# Patient Record
Sex: Female | Born: 1955 | Race: White | Hispanic: No | Smoking: Never smoker
Health system: Southern US, Community
[De-identification: ages and names within clinical notes are randomized; demographics above are authoritative.]

## PROBLEM LIST (undated history)

## (undated) DIAGNOSIS — B6790 Echinococcosis, unspecified: Secondary | ICD-10-CM

## (undated) DIAGNOSIS — E669 Obesity, unspecified: Secondary | ICD-10-CM

## (undated) DIAGNOSIS — R1310 Dysphagia, unspecified: Secondary | ICD-10-CM

## (undated) DIAGNOSIS — D369 Benign neoplasm, unspecified site: Secondary | ICD-10-CM

## (undated) DIAGNOSIS — G4730 Sleep apnea, unspecified: Secondary | ICD-10-CM

## (undated) DIAGNOSIS — E039 Hypothyroidism, unspecified: Secondary | ICD-10-CM

## (undated) DIAGNOSIS — K219 Gastro-esophageal reflux disease without esophagitis: Secondary | ICD-10-CM

## (undated) DIAGNOSIS — I38 Endocarditis, valve unspecified: Secondary | ICD-10-CM

## (undated) DIAGNOSIS — M2550 Pain in unspecified joint: Secondary | ICD-10-CM

## (undated) DIAGNOSIS — M549 Dorsalgia, unspecified: Secondary | ICD-10-CM

## (undated) DIAGNOSIS — R609 Edema, unspecified: Secondary | ICD-10-CM

## (undated) DIAGNOSIS — I10 Essential (primary) hypertension: Secondary | ICD-10-CM

## (undated) DIAGNOSIS — M069 Rheumatoid arthritis, unspecified: Secondary | ICD-10-CM

## (undated) HISTORY — PX: FOOT SURGERY: SHX648

## (undated) HISTORY — PX: PELVIC LAPAROSCOPY: SHX162

## (undated) HISTORY — DX: Sleep apnea, unspecified: G47.30

## (undated) HISTORY — PX: HERNIA REPAIR: SHX51

## (undated) HISTORY — DX: Echinococcosis, unspecified: B67.90

## (undated) HISTORY — DX: Pain in unspecified joint: M25.50

## (undated) HISTORY — DX: Benign neoplasm, unspecified site: D36.9

## (undated) HISTORY — DX: Dorsalgia, unspecified: M54.9

## (undated) HISTORY — DX: Rheumatoid arthritis, unspecified: M06.9

## (undated) HISTORY — PX: HYSTEROSCOPY: SHX211

## (undated) HISTORY — DX: Edema, unspecified: R60.9

## (undated) HISTORY — DX: Hypothyroidism, unspecified: E03.9

## (undated) HISTORY — DX: Essential (primary) hypertension: I10

## (undated) HISTORY — DX: Endocarditis, valve unspecified: I38

## (undated) HISTORY — DX: Obesity, unspecified: E66.9

## (undated) HISTORY — PX: DILATION AND CURETTAGE OF UTERUS: SHX78

## (undated) HISTORY — PX: ABDOMINAL HYSTERECTOMY: SHX81

## (undated) HISTORY — PX: MOUTH SURGERY: SHX715

## (undated) HISTORY — DX: Dysphagia, unspecified: R13.10

## (undated) HISTORY — DX: Gastro-esophageal reflux disease without esophagitis: K21.9

## (undated) HISTORY — PX: EYE SURGERY: SHX253

---

## 1998-02-11 ENCOUNTER — Encounter: Payer: Self-pay | Admitting: Internal Medicine

## 1998-02-11 ENCOUNTER — Ambulatory Visit (HOSPITAL_COMMUNITY): Admission: RE | Admit: 1998-02-11 | Discharge: 1998-02-11 | Payer: Self-pay | Admitting: Internal Medicine

## 1998-12-17 ENCOUNTER — Other Ambulatory Visit: Admission: RE | Admit: 1998-12-17 | Discharge: 1998-12-17 | Payer: Self-pay | Admitting: Obstetrics and Gynecology

## 1999-12-01 ENCOUNTER — Other Ambulatory Visit: Admission: RE | Admit: 1999-12-01 | Discharge: 1999-12-01 | Payer: Self-pay | Admitting: Obstetrics and Gynecology

## 2000-12-03 ENCOUNTER — Other Ambulatory Visit: Admission: RE | Admit: 2000-12-03 | Discharge: 2000-12-03 | Payer: Self-pay | Admitting: Obstetrics and Gynecology

## 2002-01-12 ENCOUNTER — Other Ambulatory Visit: Admission: RE | Admit: 2002-01-12 | Discharge: 2002-01-12 | Payer: Self-pay | Admitting: Obstetrics and Gynecology

## 2002-02-16 ENCOUNTER — Emergency Department (HOSPITAL_COMMUNITY): Admission: EM | Admit: 2002-02-16 | Discharge: 2002-02-16 | Payer: Self-pay | Admitting: Emergency Medicine

## 2002-02-16 ENCOUNTER — Encounter: Payer: Self-pay | Admitting: Emergency Medicine

## 2003-01-30 ENCOUNTER — Other Ambulatory Visit: Admission: RE | Admit: 2003-01-30 | Discharge: 2003-01-30 | Payer: Self-pay | Admitting: Obstetrics and Gynecology

## 2004-02-14 ENCOUNTER — Other Ambulatory Visit: Admission: RE | Admit: 2004-02-14 | Discharge: 2004-02-14 | Payer: Self-pay | Admitting: Obstetrics and Gynecology

## 2004-05-14 ENCOUNTER — Encounter: Admission: RE | Admit: 2004-05-14 | Discharge: 2004-05-14 | Payer: Self-pay | Admitting: Internal Medicine

## 2005-02-17 ENCOUNTER — Other Ambulatory Visit: Admission: RE | Admit: 2005-02-17 | Discharge: 2005-02-17 | Payer: Self-pay | Admitting: Obstetrics and Gynecology

## 2005-09-04 ENCOUNTER — Encounter: Admission: RE | Admit: 2005-09-04 | Discharge: 2005-09-04 | Payer: Self-pay | Admitting: Gastroenterology

## 2006-03-05 ENCOUNTER — Other Ambulatory Visit: Admission: RE | Admit: 2006-03-05 | Discharge: 2006-03-05 | Payer: Self-pay | Admitting: Obstetrics and Gynecology

## 2007-02-24 ENCOUNTER — Other Ambulatory Visit: Admission: RE | Admit: 2007-02-24 | Discharge: 2007-02-24 | Payer: Self-pay | Admitting: Obstetrics and Gynecology

## 2007-09-07 ENCOUNTER — Encounter: Admission: RE | Admit: 2007-09-07 | Discharge: 2007-09-07 | Payer: Self-pay | Admitting: Internal Medicine

## 2007-12-27 ENCOUNTER — Encounter: Admission: RE | Admit: 2007-12-27 | Discharge: 2007-12-27 | Payer: Self-pay | Admitting: Internal Medicine

## 2008-07-31 ENCOUNTER — Ambulatory Visit: Payer: Self-pay | Admitting: Obstetrics and Gynecology

## 2008-08-02 ENCOUNTER — Encounter: Payer: Self-pay | Admitting: Obstetrics and Gynecology

## 2008-08-02 ENCOUNTER — Ambulatory Visit: Payer: Self-pay | Admitting: Obstetrics and Gynecology

## 2008-08-02 ENCOUNTER — Other Ambulatory Visit: Admission: RE | Admit: 2008-08-02 | Discharge: 2008-08-02 | Payer: Self-pay | Admitting: Obstetrics and Gynecology

## 2008-09-25 ENCOUNTER — Ambulatory Visit (HOSPITAL_BASED_OUTPATIENT_CLINIC_OR_DEPARTMENT_OTHER): Admission: RE | Admit: 2008-09-25 | Discharge: 2008-09-25 | Payer: Self-pay | Admitting: General Surgery

## 2008-09-25 ENCOUNTER — Encounter (INDEPENDENT_AMBULATORY_CARE_PROVIDER_SITE_OTHER): Payer: Self-pay | Admitting: General Surgery

## 2009-08-21 ENCOUNTER — Encounter: Admission: RE | Admit: 2009-08-21 | Discharge: 2009-08-21 | Payer: Self-pay | Admitting: Internal Medicine

## 2009-09-13 ENCOUNTER — Ambulatory Visit: Payer: Self-pay | Admitting: Obstetrics and Gynecology

## 2009-09-13 ENCOUNTER — Other Ambulatory Visit: Admission: RE | Admit: 2009-09-13 | Discharge: 2009-09-13 | Payer: Self-pay | Admitting: Obstetrics and Gynecology

## 2010-09-23 NOTE — Op Note (Signed)
NAMEMERRIEL, Lauren Lambert           ACCOUNT NO.:  [id number]   MEDICAL RECORD NO.:  [id number]          PATIENT TYPE:  AMB   LOCATION:  DSC                          FACILITY:  [facility]   PHYSICIAN:  Ollen Gross. Vernell Morgans, M.D. DATE OF BIRTH:  July 05, 1955   DATE OF PROCEDURE:  09/25/2008  DATE OF DISCHARGE:                               OPERATIVE REPORT   PREOPERATIVE DIAGNOSIS:  Sebaceous cyst of the sternal area.   POSTOPERATIVE DIAGNOSIS:  Sebaceous cyst of the sternal area.   PROCEDURE:  Excision of sebaceous cyst.   SURGEON:  Ollen Gross. Vernell Morgans, MD   ANESTHESIA:  Local.   PROCEDURE:  After informed consent was obtained, the patient was brought  to the operating room, placed in a supine position on the operating room  table.  The patient's sternal area was then prepped with Betadine and  draped in usual sterile manner.  The area around the palpable cyst was  infiltrated with 1% lidocaine with epinephrine.  A small curvilinear  incision was made overlying the cyst with a #15 blade knife.  This  incision was carried down through the skin and subcutaneous tissue  sharply.  With a #15 blade knife into the subcutaneous fat, the cyst  itself was completely excised this way, and once it was removed, it was  sent to pathology for further evaluation.  The incision was then closed  with interrupted 4-0 Monocryl subcuticular stitches and a Dermabond  dressing was applied.  The patient tolerated the procedure well.  At the  end of the case, all needle, sponge, and instrument counts were correct.  The patient was then awakened, taken to recovery room in stable  condition.      Ollen Gross. Vernell Morgans, M.D.  Electronically Signed     PST/MEDQ  D:  09/25/2008  T:  09/26/2008  Job:  [id number]

## 2010-10-02 ENCOUNTER — Encounter: Payer: Self-pay | Admitting: Obstetrics and Gynecology

## 2010-10-13 ENCOUNTER — Other Ambulatory Visit (HOSPITAL_COMMUNITY)
Admission: RE | Admit: 2010-10-13 | Discharge: 2010-10-13 | Disposition: A | Discharge status: Home / Self Care | Payer: 59 | Source: Ambulatory Visit | Attending: Obstetrics and Gynecology | Admitting: Obstetrics and Gynecology

## 2010-10-13 ENCOUNTER — Other Ambulatory Visit: Payer: Self-pay | Admitting: Obstetrics and Gynecology

## 2010-10-13 ENCOUNTER — Encounter (INDEPENDENT_AMBULATORY_CARE_PROVIDER_SITE_OTHER): Payer: 59 | Admitting: Obstetrics and Gynecology

## 2010-10-13 DIAGNOSIS — Z124 Encounter for screening for malignant neoplasm of cervix: Secondary | ICD-10-CM | POA: Insufficient documentation

## 2010-10-13 DIAGNOSIS — Z01419 Encounter for gynecological examination (general) (routine) without abnormal findings: Secondary | ICD-10-CM

## 2012-01-06 ENCOUNTER — Encounter: Payer: Self-pay | Admitting: Gynecology

## 2012-01-06 DIAGNOSIS — E039 Hypothyroidism, unspecified: Secondary | ICD-10-CM | POA: Insufficient documentation

## 2012-01-06 DIAGNOSIS — I10 Essential (primary) hypertension: Secondary | ICD-10-CM | POA: Insufficient documentation

## 2012-01-06 DIAGNOSIS — B6790 Echinococcosis, unspecified: Secondary | ICD-10-CM | POA: Insufficient documentation

## 2012-01-12 ENCOUNTER — Ambulatory Visit (INDEPENDENT_AMBULATORY_CARE_PROVIDER_SITE_OTHER): Payer: 59 | Admitting: Obstetrics and Gynecology

## 2012-01-12 ENCOUNTER — Encounter: Payer: Self-pay | Admitting: Obstetrics and Gynecology

## 2012-01-12 VITALS — BP 120/70 | Ht 63.0 in | Wt 276.0 lb

## 2012-01-12 DIAGNOSIS — Z01419 Encounter for gynecological examination (general) (routine) without abnormal findings: Secondary | ICD-10-CM

## 2012-01-12 DIAGNOSIS — L293 Anogenital pruritus, unspecified: Secondary | ICD-10-CM

## 2012-01-12 DIAGNOSIS — N898 Other specified noninflammatory disorders of vagina: Secondary | ICD-10-CM

## 2012-01-12 LAB — WET PREP FOR TRICH, YEAST, CLUE

## 2012-01-12 MED ORDER — TERCONAZOLE 0.8 % VA CREA: 1.0000 | TOPICAL_CREAM | Freq: Every day | VAGINAL | Status: AC

## 2012-01-12 MED ORDER — BETAMETHASONE DIPROPIONATE AUG 0.05 % EX OINT: TOPICAL_OINTMENT | Freq: Two times a day (BID) | CUTANEOUS | Status: AC

## 2012-01-12 NOTE — Progress Notes (Signed)
Patient came to see me today for her annual GYN exam. She is not having menopausal symptoms. She is status post TAH done in 1995 for adenomyosis, fibroid, dysmenorrhea and menorrhagia. The patient has always had normal Pap smears. Her last Pap smear was 2012. She had her yearly mammogram today. She has had 2 normal bone densities. The last one was in 2010. She has been having vulvar and vaginal itching. She took a Teacher, adult education  and is much better but not cured. She has also noticed recently a rash on her right breast and chest wall which is very itchy. She is fine without estrogen replacement. She was enablex for detrussor instability but stopped it and is fine now. She does not have dysuria, frequency, urgency or incontinence.  HEENT: Within normal limits.Kennon Portela present. Neck: No masses. Supraclavicular lymph nodes: Not enlarged. Breasts: Examined in both sitting and lying position. Symmetrical without skin changes or masses. There is an erythematous raised rash on the lateral side of her right breast extending further lateral. It is in  several patches. Abdomen: Soft no masses guarding or rebound. No hernias. Pelvic: External within normal limits. BUS within normal limits. Vaginal examination shows good estrogen effect, no cystocele enterocele or rectocele. There is very little discharge and wet prep was negative. Cervix and uterus absent. Adnexa within normal limits. Rectovaginal confirmatory. Extremities within normal limits.  Assessment: #1. Residual yeast vaginitis #2. Skin rash  Plan: Terconazole 3 cream. Continue yearly mammograms. Diprolene ointment twice a day to rash. Dermatology visit if it persists.The new Pap smear guidelines were discussed with the patient. No pap done.

## 2012-01-12 NOTE — Patient Instructions (Signed)
Continue yearly mammograms 

## 2012-01-13 ENCOUNTER — Encounter: Payer: Self-pay | Admitting: Obstetrics and Gynecology

## 2012-01-13 LAB — URINALYSIS W MICROSCOPIC + REFLEX CULTURE
Bilirubin Urine: NEGATIVE
Ketones, ur: NEGATIVE mg/dL
Protein, ur: NEGATIVE mg/dL
Specific Gravity, Urine: 1.022 (ref 1.005–1.030)
Urobilinogen, UA: 0.2 mg/dL (ref 0.0–1.0)

## 2012-01-21 ENCOUNTER — Encounter: Payer: Self-pay | Admitting: Obstetrics and Gynecology

## 2015-11-21 DIAGNOSIS — R1010 Upper abdominal pain, unspecified: Secondary | ICD-10-CM | POA: Diagnosis not present

## 2015-11-21 DIAGNOSIS — K429 Umbilical hernia without obstruction or gangrene: Secondary | ICD-10-CM | POA: Diagnosis not present

## 2015-11-21 DIAGNOSIS — K439 Ventral hernia without obstruction or gangrene: Secondary | ICD-10-CM | POA: Diagnosis not present

## 2015-12-02 DIAGNOSIS — N3941 Urge incontinence: Secondary | ICD-10-CM | POA: Diagnosis not present

## 2015-12-02 DIAGNOSIS — Z6841 Body Mass Index (BMI) 40.0 and over, adult: Secondary | ICD-10-CM | POA: Diagnosis not present

## 2015-12-02 DIAGNOSIS — K429 Umbilical hernia without obstruction or gangrene: Secondary | ICD-10-CM | POA: Diagnosis not present

## 2015-12-09 DIAGNOSIS — K429 Umbilical hernia without obstruction or gangrene: Secondary | ICD-10-CM | POA: Diagnosis not present

## 2015-12-19 DIAGNOSIS — L579 Skin changes due to chronic exposure to nonionizing radiation, unspecified: Secondary | ICD-10-CM | POA: Diagnosis not present

## 2015-12-19 DIAGNOSIS — L298 Other pruritus: Secondary | ICD-10-CM | POA: Diagnosis not present

## 2015-12-19 DIAGNOSIS — L308 Other specified dermatitis: Secondary | ICD-10-CM | POA: Diagnosis not present

## 2015-12-19 DIAGNOSIS — D225 Melanocytic nevi of trunk: Secondary | ICD-10-CM | POA: Diagnosis not present

## 2015-12-20 DIAGNOSIS — Z01818 Encounter for other preprocedural examination: Secondary | ICD-10-CM | POA: Diagnosis not present

## 2015-12-20 DIAGNOSIS — K429 Umbilical hernia without obstruction or gangrene: Secondary | ICD-10-CM | POA: Diagnosis not present

## 2015-12-24 DIAGNOSIS — Z886 Allergy status to analgesic agent status: Secondary | ICD-10-CM | POA: Diagnosis not present

## 2015-12-24 DIAGNOSIS — Z885 Allergy status to narcotic agent status: Secondary | ICD-10-CM | POA: Diagnosis not present

## 2015-12-24 DIAGNOSIS — I10 Essential (primary) hypertension: Secondary | ICD-10-CM | POA: Diagnosis not present

## 2015-12-24 DIAGNOSIS — K5790 Diverticulosis of intestine, part unspecified, without perforation or abscess without bleeding: Secondary | ICD-10-CM | POA: Diagnosis not present

## 2015-12-24 DIAGNOSIS — K439 Ventral hernia without obstruction or gangrene: Secondary | ICD-10-CM | POA: Diagnosis not present

## 2015-12-24 DIAGNOSIS — Z91040 Latex allergy status: Secondary | ICD-10-CM | POA: Diagnosis not present

## 2015-12-24 DIAGNOSIS — K219 Gastro-esophageal reflux disease without esophagitis: Secondary | ICD-10-CM | POA: Diagnosis not present

## 2015-12-24 DIAGNOSIS — K429 Umbilical hernia without obstruction or gangrene: Secondary | ICD-10-CM | POA: Diagnosis not present

## 2015-12-24 DIAGNOSIS — K449 Diaphragmatic hernia without obstruction or gangrene: Secondary | ICD-10-CM | POA: Diagnosis not present

## 2015-12-24 DIAGNOSIS — Z6841 Body Mass Index (BMI) 40.0 and over, adult: Secondary | ICD-10-CM | POA: Diagnosis not present

## 2015-12-25 DIAGNOSIS — K439 Ventral hernia without obstruction or gangrene: Secondary | ICD-10-CM | POA: Diagnosis not present

## 2015-12-25 DIAGNOSIS — K449 Diaphragmatic hernia without obstruction or gangrene: Secondary | ICD-10-CM | POA: Diagnosis not present

## 2015-12-25 DIAGNOSIS — I10 Essential (primary) hypertension: Secondary | ICD-10-CM | POA: Diagnosis not present

## 2015-12-25 DIAGNOSIS — K219 Gastro-esophageal reflux disease without esophagitis: Secondary | ICD-10-CM | POA: Diagnosis not present

## 2015-12-25 DIAGNOSIS — Z91040 Latex allergy status: Secondary | ICD-10-CM | POA: Diagnosis not present

## 2015-12-25 DIAGNOSIS — K5790 Diverticulosis of intestine, part unspecified, without perforation or abscess without bleeding: Secondary | ICD-10-CM | POA: Diagnosis not present

## 2015-12-25 DIAGNOSIS — Z6841 Body Mass Index (BMI) 40.0 and over, adult: Secondary | ICD-10-CM | POA: Diagnosis not present

## 2015-12-25 DIAGNOSIS — Z885 Allergy status to narcotic agent status: Secondary | ICD-10-CM | POA: Diagnosis not present

## 2015-12-25 DIAGNOSIS — Z886 Allergy status to analgesic agent status: Secondary | ICD-10-CM | POA: Diagnosis not present

## 2016-01-01 DIAGNOSIS — L308 Other specified dermatitis: Secondary | ICD-10-CM | POA: Diagnosis not present

## 2016-01-01 DIAGNOSIS — L298 Other pruritus: Secondary | ICD-10-CM | POA: Diagnosis not present

## 2016-01-14 DIAGNOSIS — G4733 Obstructive sleep apnea (adult) (pediatric): Secondary | ICD-10-CM | POA: Diagnosis not present

## 2016-01-29 DIAGNOSIS — K429 Umbilical hernia without obstruction or gangrene: Secondary | ICD-10-CM | POA: Diagnosis not present

## 2016-01-31 DIAGNOSIS — K429 Umbilical hernia without obstruction or gangrene: Secondary | ICD-10-CM | POA: Diagnosis not present

## 2016-02-09 DIAGNOSIS — L03113 Cellulitis of right upper limb: Secondary | ICD-10-CM | POA: Diagnosis not present

## 2016-02-12 DIAGNOSIS — K8020 Calculus of gallbladder without cholecystitis without obstruction: Secondary | ICD-10-CM | POA: Diagnosis not present

## 2016-02-13 DIAGNOSIS — G4733 Obstructive sleep apnea (adult) (pediatric): Secondary | ICD-10-CM | POA: Diagnosis not present

## 2016-02-20 DIAGNOSIS — L308 Other specified dermatitis: Secondary | ICD-10-CM | POA: Diagnosis not present

## 2016-02-20 DIAGNOSIS — R232 Flushing: Secondary | ICD-10-CM | POA: Diagnosis not present

## 2016-02-20 DIAGNOSIS — L298 Other pruritus: Secondary | ICD-10-CM | POA: Diagnosis not present

## 2016-03-02 DIAGNOSIS — M1711 Unilateral primary osteoarthritis, right knee: Secondary | ICD-10-CM | POA: Diagnosis not present

## 2016-03-02 DIAGNOSIS — M25561 Pain in right knee: Secondary | ICD-10-CM | POA: Diagnosis not present

## 2016-03-04 DIAGNOSIS — M25561 Pain in right knee: Secondary | ICD-10-CM | POA: Diagnosis not present

## 2016-03-04 DIAGNOSIS — M1711 Unilateral primary osteoarthritis, right knee: Secondary | ICD-10-CM | POA: Diagnosis not present

## 2016-03-04 DIAGNOSIS — M62551 Muscle wasting and atrophy, not elsewhere classified, right thigh: Secondary | ICD-10-CM | POA: Diagnosis not present

## 2016-03-05 DIAGNOSIS — M25561 Pain in right knee: Secondary | ICD-10-CM | POA: Diagnosis not present

## 2016-03-05 DIAGNOSIS — M1711 Unilateral primary osteoarthritis, right knee: Secondary | ICD-10-CM | POA: Diagnosis not present

## 2016-03-05 DIAGNOSIS — M62551 Muscle wasting and atrophy, not elsewhere classified, right thigh: Secondary | ICD-10-CM | POA: Diagnosis not present

## 2016-03-06 DIAGNOSIS — M62551 Muscle wasting and atrophy, not elsewhere classified, right thigh: Secondary | ICD-10-CM | POA: Diagnosis not present

## 2016-03-06 DIAGNOSIS — M1711 Unilateral primary osteoarthritis, right knee: Secondary | ICD-10-CM | POA: Diagnosis not present

## 2016-03-06 DIAGNOSIS — M25561 Pain in right knee: Secondary | ICD-10-CM | POA: Diagnosis not present

## 2016-03-10 DIAGNOSIS — M62551 Muscle wasting and atrophy, not elsewhere classified, right thigh: Secondary | ICD-10-CM | POA: Diagnosis not present

## 2016-03-10 DIAGNOSIS — M25561 Pain in right knee: Secondary | ICD-10-CM | POA: Diagnosis not present

## 2016-03-10 DIAGNOSIS — M1711 Unilateral primary osteoarthritis, right knee: Secondary | ICD-10-CM | POA: Diagnosis not present

## 2016-03-11 DIAGNOSIS — M62551 Muscle wasting and atrophy, not elsewhere classified, right thigh: Secondary | ICD-10-CM | POA: Diagnosis not present

## 2016-03-11 DIAGNOSIS — M1711 Unilateral primary osteoarthritis, right knee: Secondary | ICD-10-CM | POA: Diagnosis not present

## 2016-03-11 DIAGNOSIS — M25561 Pain in right knee: Secondary | ICD-10-CM | POA: Diagnosis not present

## 2016-03-24 DIAGNOSIS — M25551 Pain in right hip: Secondary | ICD-10-CM | POA: Diagnosis not present

## 2016-03-24 DIAGNOSIS — M1711 Unilateral primary osteoarthritis, right knee: Secondary | ICD-10-CM | POA: Diagnosis not present

## 2016-03-24 DIAGNOSIS — M2241 Chondromalacia patellae, right knee: Secondary | ICD-10-CM | POA: Diagnosis not present

## 2016-03-24 DIAGNOSIS — M25561 Pain in right knee: Secondary | ICD-10-CM | POA: Diagnosis not present

## 2016-03-24 DIAGNOSIS — M62551 Muscle wasting and atrophy, not elsewhere classified, right thigh: Secondary | ICD-10-CM | POA: Diagnosis not present

## 2016-03-24 DIAGNOSIS — M25552 Pain in left hip: Secondary | ICD-10-CM | POA: Diagnosis not present

## 2016-03-25 DIAGNOSIS — M25561 Pain in right knee: Secondary | ICD-10-CM | POA: Diagnosis not present

## 2016-03-25 DIAGNOSIS — M1711 Unilateral primary osteoarthritis, right knee: Secondary | ICD-10-CM | POA: Diagnosis not present

## 2016-03-25 DIAGNOSIS — M62551 Muscle wasting and atrophy, not elsewhere classified, right thigh: Secondary | ICD-10-CM | POA: Diagnosis not present

## 2016-03-27 DIAGNOSIS — M62551 Muscle wasting and atrophy, not elsewhere classified, right thigh: Secondary | ICD-10-CM | POA: Diagnosis not present

## 2016-03-27 DIAGNOSIS — M1711 Unilateral primary osteoarthritis, right knee: Secondary | ICD-10-CM | POA: Diagnosis not present

## 2016-03-27 DIAGNOSIS — M25561 Pain in right knee: Secondary | ICD-10-CM | POA: Diagnosis not present

## 2016-05-05 DIAGNOSIS — M25461 Effusion, right knee: Secondary | ICD-10-CM | POA: Diagnosis not present

## 2016-05-05 DIAGNOSIS — M25551 Pain in right hip: Secondary | ICD-10-CM | POA: Diagnosis not present

## 2016-05-05 DIAGNOSIS — M2241 Chondromalacia patellae, right knee: Secondary | ICD-10-CM | POA: Diagnosis not present

## 2016-05-07 DIAGNOSIS — M1711 Unilateral primary osteoarthritis, right knee: Secondary | ICD-10-CM | POA: Diagnosis not present

## 2016-05-07 DIAGNOSIS — S83241D Other tear of medial meniscus, current injury, right knee, subsequent encounter: Secondary | ICD-10-CM | POA: Diagnosis not present

## 2016-05-07 DIAGNOSIS — R294 Clicking hip: Secondary | ICD-10-CM | POA: Diagnosis not present

## 2016-05-07 DIAGNOSIS — M25551 Pain in right hip: Secondary | ICD-10-CM | POA: Diagnosis not present

## 2016-05-08 DIAGNOSIS — Z1231 Encounter for screening mammogram for malignant neoplasm of breast: Secondary | ICD-10-CM | POA: Diagnosis not present

## 2016-05-12 DIAGNOSIS — M1711 Unilateral primary osteoarthritis, right knee: Secondary | ICD-10-CM | POA: Diagnosis not present

## 2016-05-19 DIAGNOSIS — M1711 Unilateral primary osteoarthritis, right knee: Secondary | ICD-10-CM | POA: Diagnosis not present

## 2016-05-26 DIAGNOSIS — M1711 Unilateral primary osteoarthritis, right knee: Secondary | ICD-10-CM | POA: Diagnosis not present

## 2016-06-02 DIAGNOSIS — M25562 Pain in left knee: Secondary | ICD-10-CM | POA: Diagnosis not present

## 2016-06-02 DIAGNOSIS — M25551 Pain in right hip: Secondary | ICD-10-CM | POA: Diagnosis not present

## 2016-06-02 DIAGNOSIS — M1711 Unilateral primary osteoarthritis, right knee: Secondary | ICD-10-CM | POA: Diagnosis not present

## 2016-06-02 DIAGNOSIS — N6001 Solitary cyst of right breast: Secondary | ICD-10-CM | POA: Diagnosis not present

## 2016-06-02 DIAGNOSIS — N6313 Unspecified lump in the right breast, lower outer quadrant: Secondary | ICD-10-CM | POA: Diagnosis not present

## 2016-06-02 DIAGNOSIS — R928 Other abnormal and inconclusive findings on diagnostic imaging of breast: Secondary | ICD-10-CM | POA: Diagnosis not present

## 2016-06-02 DIAGNOSIS — N6312 Unspecified lump in the right breast, upper inner quadrant: Secondary | ICD-10-CM | POA: Diagnosis not present

## 2016-06-09 DIAGNOSIS — M1711 Unilateral primary osteoarthritis, right knee: Secondary | ICD-10-CM | POA: Diagnosis not present

## 2016-06-16 DIAGNOSIS — M1711 Unilateral primary osteoarthritis, right knee: Secondary | ICD-10-CM | POA: Diagnosis not present

## 2016-07-15 DIAGNOSIS — J019 Acute sinusitis, unspecified: Secondary | ICD-10-CM | POA: Diagnosis not present

## 2016-07-15 DIAGNOSIS — H109 Unspecified conjunctivitis: Secondary | ICD-10-CM | POA: Diagnosis not present

## 2016-07-29 DIAGNOSIS — J069 Acute upper respiratory infection, unspecified: Secondary | ICD-10-CM | POA: Diagnosis not present

## 2016-07-31 DIAGNOSIS — J069 Acute upper respiratory infection, unspecified: Secondary | ICD-10-CM | POA: Diagnosis not present

## 2016-09-09 ENCOUNTER — Telehealth: Payer: Self-pay | Admitting: *Deleted

## 2016-09-09 NOTE — Telephone Encounter (Signed)
Pt returned call and states that she received the new patient packet in her email and she will fill it out and return it tonight. States she did not have time to complete call.

## 2016-09-09 NOTE — Telephone Encounter (Signed)
PreVisit Call attempted. Left VM. 

## 2016-09-10 ENCOUNTER — Ambulatory Visit (INDEPENDENT_AMBULATORY_CARE_PROVIDER_SITE_OTHER): Payer: BLUE CROSS/BLUE SHIELD | Admitting: Family Medicine

## 2016-09-10 ENCOUNTER — Encounter: Payer: Self-pay | Admitting: Family Medicine

## 2016-09-10 VITALS — BP 124/72 | HR 84 | Temp 97.9°F | Ht 63.0 in | Wt 271.2 lb

## 2016-09-10 DIAGNOSIS — I10 Essential (primary) hypertension: Secondary | ICD-10-CM | POA: Diagnosis not present

## 2016-09-10 DIAGNOSIS — J0100 Acute maxillary sinusitis, unspecified: Secondary | ICD-10-CM | POA: Diagnosis not present

## 2016-09-10 MED ORDER — CEFDINIR 300 MG PO CAPS: 300.0000 mg | ORAL_CAPSULE | Freq: Two times a day (BID) | ORAL | 0 refills | Status: DC

## 2016-09-10 MED ORDER — LOSARTAN POTASSIUM-HCTZ 100-12.5 MG PO TABS: 1.0000 | ORAL_TABLET | Freq: Every day | ORAL | 2 refills | Status: DC

## 2016-09-10 MED ORDER — PREDNISONE 5 MG PO TABS: ORAL_TABLET | ORAL | 0 refills | Status: DC

## 2016-09-10 NOTE — Addendum Note (Signed)
Addended by: Frutoso Chase A on: 09/10/2016 10:25 AM   Modules accepted: Orders

## 2016-09-10 NOTE — Progress Notes (Signed)
Lauren Lambert is a 61 y.o. female is here to [place].   History of Present Illness:   [name] CMA acting as scribe for Dr. Juleen China.  CC: Patient comes in today to establish care. She has had ear pain, congestion, and cough since March 1st. She has been on a Zpak and Amoxicillin. She had conjunctivitis as well and was put on an eye drop. This cleared up the eyes.  Sinusitis  This is a recurrent problem. The current episode started 1 to 4 weeks ago. The problem has been rapidly worsening since onset. There has been no fever. The pain is moderate. Associated symptoms include congestion, coughing, ear pain, sinus pressure and swollen glands. Pertinent negatives include no chills, diaphoresis, headaches, hoarse voice, neck pain, shortness of breath, sneezing or sore throat. Past treatments include antibiotics, oral decongestants and acetaminophen. The treatment provided mild relief.  Knee Pain   The pain is present in the left knee and right knee. The quality of the pain is described as aching. The pain is mild. The pain has been intermittent since onset. The symptoms are aggravated by weight bearing. Treatments tried: most recently - PRP and stem cell injections. The treatment provided moderate relief.  Obesity: New Discussion  Patient complains of obesity. Patient cites health as reasons for wanting to lose weight.Successful weight loss techniques attempted: Weight Watchers. Current Exercise Habits: none. Barriers: Travel and hunger.  Health Maintenance Due  Topic Date Due  . Hepatitis C Screening  03/07/1956  . HIV Screening  08/06/1972  . TETANUS/TDAP  08/06/1976  . MAMMOGRAM  08/07/2007  . COLONOSCOPY  08/07/2007  . PAP SMEAR  10/12/2013   PMHx, SurgHx, SocialHx, Medications, and Allergies were reviewed in the Visit Navigator and updated as appropriate.   Past Medical History:  Diagnosis Date  . Hydatid cyst    Right  . Hypertension   . Hypothyroidism    Past  Surgical History:  Procedure Laterality Date  . ABDOMINAL HYSTERECTOMY    . DILATION AND CURETTAGE OF UTERUS    . EYE SURGERY    . FOOT SURGERY    . HYSTEROSCOPY    . MOUTH SURGERY    . PELVIC LAPAROSCOPY     Family History  Problem Relation Age of Onset  . Hypertension Mother   . Heart disease Mother   . Hypertension Father   . Heart disease Father   . Breast cancer Maternal Aunt     Age 35's  . Colon cancer Maternal Grandmother   . Uterine cancer Maternal Aunt    Social History  Substance Use Topics  . Smoking status: Never Smoker  . Smokeless tobacco: Never Used  . Alcohol use 3.0 oz/week    6 Standard drinks or equivalent per week   Current Medications and Allergies:   .  losartan-hydrochlorothiazide (HYZAAR) 100-12.5 MG tablet, Take 1 tablet by mouth daily., Disp: 90 tablet, Rfl: 2 .  Multiple Vitamin (MULTIVITAMIN) tablet, Take 1 tablet by mouth daily., Disp: , Rfl:  .  Probiotic Product (ALIGN) 4 MG CAPS, Take by mouth., Disp: , Rfl:   Allergies  Allergen Reactions  . Adhesive [Tape]   . Aspirin   . Ciprofloxacin Other (See Comments)  . Codeine Nausea Only  . Hydrocodone-Acetaminophen Other (See Comments)  . Latex    Review of Systems:   Review of Systems  Constitutional: Positive for malaise/fatigue. Negative for chills, diaphoresis and fever.  HENT: Positive for congestion, ear pain, sinus pain and sinus  pressure. Negative for hoarse voice, sneezing and sore throat.   Eyes: Negative for blurred vision and double vision.  Respiratory: Positive for cough. Negative for shortness of breath and wheezing.   Cardiovascular: Negative for chest pain, palpitations and leg swelling.  Gastrointestinal: Negative for abdominal pain, constipation, diarrhea and vomiting.  Genitourinary: Negative for dysuria.  Musculoskeletal: Positive for joint pain. Negative for back pain and neck pain.       Chronic.   Skin: Negative for itching and rash.  Neurological: Negative for  dizziness and headaches.  Psychiatric/Behavioral: Negative for depression, hallucinations and memory loss.   Vitals:   Vitals:   09/10/16 0909  BP: 124/72  Pulse: 84  Temp: 97.9 F (36.6 C)  TempSrc: Oral  SpO2: 96%  Weight: 271 lb 3.2 oz (123 kg)  Height: 5\' 3"  (1.6 m)     Body mass index is 48.04 kg/m.  Physical Exam:   Physical Exam  Constitutional: She is oriented to person, place, and time. She appears well-nourished.  HENT:  Head: Normocephalic and atraumatic.  Nose: Rhinorrhea present. Right sinus exhibits maxillary sinus tenderness. Left sinus exhibits maxillary sinus tenderness.  Eyes: EOM are normal. Pupils are equal, round, and reactive to light.  Neck: Normal range of motion. Neck supple.  Cardiovascular: Normal rate, regular rhythm, normal heart sounds and intact distal pulses.   Pulmonary/Chest: Effort normal.  Abdominal: Soft.  Musculoskeletal:       Right knee: Tenderness found. Medial joint line and lateral joint line tenderness noted.       Left knee: Tenderness found. Medial joint line and lateral joint line tenderness noted.  Neurological: She is alert and oriented to person, place, and time.  Skin: Skin is warm.  Psychiatric: She has a normal mood and affect. Her behavior is normal.  Nursing note and vitals reviewed.  Assessment and Plan:   Lauren Lambert was seen today for establish care.  Diagnoses and all orders for this visit:  Essential hypertension Comments: Stable. Controlled on current medication without side effects. Orders: -     losartan-hydrochlorothiazide (HYZAAR) 100-12.5 MG tablet; Take 1 tablet by mouth daily. -     Comprehensive metabolic panel; Future  Morbid obesity ([place]) Comments: We reviewed multiple weight loss strategies. I recommend evaluation with treatment options by Dr. Leafy Ro. Labs pending. Upcoming physical exam.. Orders: -     Amb Referral to Bariatric Surgery -     CBC -     Comprehensive metabolic panel;  Future -     Lipid panel; Future -     TSH; Future -     Hemoglobin A1c; Future -     T3, free; Future -     Insulin, Free (Bioactive); Future  Subacute maxillary sinusitis Comments: Recurrent. With associated left eustachian tube dysfunction. Treatment as below. Red flags reviewed. Orders: -     cefdinir (OMNICEF) 300 MG capsule; Take 1 capsule (300 mg total) by mouth 2 (two) times daily. -     predniSONE (DELTASONE) 5 MG tablet; 6,5,4,3,2,1,done    . Reviewed expectations re: course of current medical issues. . Discussed self-management of symptoms. . Outlined signs and symptoms indicating need for more acute intervention. . Patient verbalized understanding and all questions were answered. . See orders for this visit as documented in the electronic medical record. . Patient received an After Visit Summary.  Records requested if needed. I spent 45 minutes with this patient, greater than 50% was face-to-face time counseling regarding the above diagnoses.  CMA  served as Education administrator during this visit. History, Physical, and Plan performed by medical provider. Documentation and orders reviewed and attested to. Briscoe Deutscher, D.O.  Briscoe Deutscher, [place], [place] [facility] 09/10/2016

## 2016-09-15 ENCOUNTER — Telehealth: Payer: Self-pay | Admitting: Family Medicine

## 2016-09-15 MED ORDER — DOXYCYCLINE HYCLATE 100 MG PO TABS: 100.0000 mg | ORAL_TABLET | Freq: Two times a day (BID) | ORAL | 0 refills | Status: DC

## 2016-09-15 NOTE — Telephone Encounter (Signed)
I called in DOXY. Try for one week. If not better, come in for recheck.

## 2016-09-15 NOTE — Telephone Encounter (Signed)
Patient called to advise that she is still taking her antibiotics however, her sinus infection has come back. Please call patient to advise.

## 2016-09-15 NOTE — Telephone Encounter (Signed)
Spoke with patient and she stated that has been on the antibiotic for almost a week. Her congestion had gotten better and now the mucus is turning green again. Cough is worse today. Do you want her to be seen.

## 2016-09-15 NOTE — Addendum Note (Signed)
Addended by: Briscoe Deutscher R on: 09/15/2016 08:00 PM   Modules accepted: Orders

## 2016-09-16 NOTE — Telephone Encounter (Signed)
Spoke with patient and she stated that she is doing better today. She is not going to pick up antibiotic unless she starts feeling bad again.

## 2016-09-18 ENCOUNTER — Other Ambulatory Visit (INDEPENDENT_AMBULATORY_CARE_PROVIDER_SITE_OTHER): Payer: BLUE CROSS/BLUE SHIELD

## 2016-09-18 DIAGNOSIS — I10 Essential (primary) hypertension: Secondary | ICD-10-CM

## 2016-09-18 LAB — T3, FREE: T3, Free: 3.2 pg/mL (ref 2.3–4.2)

## 2016-09-18 LAB — LIPID PANEL
Cholesterol: 173 mg/dL (ref 0–200)
HDL: 49.8 mg/dL (ref >39.00)
LDL Cholesterol: 105 mg/dL — ABNORMAL HIGH (ref 0–99)
NonHDL: 123.69
Total CHOL/HDL Ratio: 3
Triglycerides: 93 mg/dL (ref 0.0–149.0)
VLDL: 18.6 mg/dL (ref 0.0–40.0)

## 2016-09-18 LAB — COMPREHENSIVE METABOLIC PANEL
ALT: 13 U/L (ref 0–35)
AST: 14 U/L (ref 0–37)
Albumin: 4.3 g/dL (ref 3.5–5.2)
Alkaline Phosphatase: 78 U/L (ref 39–117)
BUN: 20 mg/dL (ref 6–23)
CO2: 27 mEq/L (ref 19–32)
Calcium: 9.3 mg/dL (ref 8.4–10.5)
Chloride: 101 mEq/L (ref 96–112)
Creatinine, Ser: 0.83 mg/dL (ref 0.40–1.20)
GFR: 74.76 mL/min (ref >60.00)
Glucose, Bld: 95 mg/dL (ref 70–99)
Potassium: 3.6 mEq/L (ref 3.5–5.1)
Sodium: 136 mEq/L (ref 135–145)
Total Bilirubin: 0.8 mg/dL (ref 0.2–1.2)
Total Protein: 7.3 g/dL (ref 6.0–8.3)

## 2016-09-18 LAB — CBC
HCT: 42.9 % (ref 36.0–46.0)
Hemoglobin: 14.4 g/dL (ref 12.0–15.0)
MCHC: 33.6 g/dL (ref 30.0–36.0)
MCV: 83.5 fl (ref 78.0–100.0)
Platelets: 289 10*3/uL (ref 150.0–400.0)
RBC: 5.14 Mil/uL — ABNORMAL HIGH (ref 3.87–5.11)
RDW: 15.3 % (ref 11.5–15.5)
WBC: 7.8 10*3/uL (ref 4.0–10.5)

## 2016-09-18 LAB — TSH: TSH: 2.86 u[IU]/mL (ref 0.35–4.50)

## 2016-09-18 LAB — HEMOGLOBIN A1C: Hgb A1c MFr Bld: 6.1 % (ref 4.6–6.5)

## 2016-09-24 ENCOUNTER — Encounter (INDEPENDENT_AMBULATORY_CARE_PROVIDER_SITE_OTHER): Payer: Self-pay | Admitting: Family Medicine

## 2016-09-24 LAB — INSULIN, FREE (BIOACTIVE): Insulin, Free: 7.5 u[IU]/mL (ref 1.5–14.9)

## 2016-10-01 ENCOUNTER — Encounter: Payer: BLUE CROSS/BLUE SHIELD | Admitting: Family Medicine

## 2016-10-06 ENCOUNTER — Telehealth: Payer: Self-pay | Admitting: Family Medicine

## 2016-10-06 DIAGNOSIS — R05 Cough: Secondary | ICD-10-CM | POA: Diagnosis not present

## 2016-10-06 DIAGNOSIS — R0982 Postnasal drip: Secondary | ICD-10-CM | POA: Diagnosis not present

## 2016-10-06 DIAGNOSIS — R062 Wheezing: Secondary | ICD-10-CM | POA: Diagnosis not present

## 2016-10-06 DIAGNOSIS — J309 Allergic rhinitis, unspecified: Secondary | ICD-10-CM | POA: Diagnosis not present

## 2016-10-06 NOTE — Telephone Encounter (Signed)
Patient Name: Lauren Lambert  DOB: 1956-04-03    Initial Comment Caller states that 3 wks ago she was told she had food in her lungs and a blocked eustachian tube. She was prescribed prednisone and antibiotics.She states that her friend noticed that when she coughs she has a crackle in her lungs. She is currently out of town in [facility] and wants to know if she needs more antbioitcs.    Nurse Assessment  Nurse: Raphael Gibney, RN, Vanita Ingles Date/Time (Eastern Time): 10/06/2016 1:47:27 PM  Confirm and document reason for call. If symptomatic, describe symptoms. ---Caller states she was seen 3 weeks ago for food in her lungs and blocked eustachian tubes. She took prednisone and a round of antibiotics. She called because she was still congested and the doctor ordered more antibiotics which she has not filled. She is continuing her zyrtec. She is coughing more often and her friend is feeling crackles in her lungs at her back when she coughs. No fever. She is out of town in [place]. Cough is still productive mostly clear and white but sometimes is yellow. She has noticed wheezing when she lays down.  Does the patient have any new or worsening symptoms? ---Yes  Will a triage be completed? ---Yes  Related visit to physician within the last 2 weeks? ---No  Does the PT have any chronic conditions? (i.e. diabetes, asthma, etc.) ---No  Is this a behavioral health or substance abuse call? ---No     Guidelines    Guideline Title Affirmed Question Affirmed Notes  Cough - Acute Productive [1] Continuous (nonstop) coughing interferes with work or school AND [2] no improvement using cough treatment per Care Advice    Final Disposition User   See Physician within 24 Hours [place], Therapist, sports, Vanita Ingles    Comments  pt is out of town in [facility] and does not want to go to urgent care She wants to know if she needs another round of antibiotics or is there something else she needs to buy or the doctor can prescribe to help the crackles in her lungs.  Please call pt back regarding medication.   Referrals  GO TO FACILITY REFUSED   Disagree/Comply: Disagree  Disagree/Comply Reason: Disagree with instructions

## 2016-10-06 NOTE — Telephone Encounter (Signed)
Will need eval. Try urgent care if out of state.

## 2016-10-06 NOTE — Telephone Encounter (Signed)
Pt is aware of annotations and agreeable for urgent care.

## 2016-10-15 ENCOUNTER — Ambulatory Visit (INDEPENDENT_AMBULATORY_CARE_PROVIDER_SITE_OTHER): Payer: BLUE CROSS/BLUE SHIELD | Admitting: Family Medicine

## 2016-10-15 ENCOUNTER — Encounter: Payer: Self-pay | Admitting: Family Medicine

## 2016-10-15 ENCOUNTER — Encounter (INDEPENDENT_AMBULATORY_CARE_PROVIDER_SITE_OTHER): Payer: Self-pay | Admitting: Family Medicine

## 2016-10-15 VITALS — BP 114/80 | HR 85 | Temp 98.0°F | Ht 63.0 in | Wt 270.0 lb

## 2016-10-15 VITALS — BP 123/79 | HR 88 | Temp 97.9°F | Ht 63.0 in | Wt 264.0 lb

## 2016-10-15 DIAGNOSIS — I10 Essential (primary) hypertension: Secondary | ICD-10-CM | POA: Diagnosis not present

## 2016-10-15 DIAGNOSIS — Z1331 Encounter for screening for depression: Secondary | ICD-10-CM

## 2016-10-15 DIAGNOSIS — Z0000 Encounter for general adult medical examination without abnormal findings: Secondary | ICD-10-CM | POA: Diagnosis not present

## 2016-10-15 DIAGNOSIS — R5383 Other fatigue: Secondary | ICD-10-CM

## 2016-10-15 DIAGNOSIS — M174 Other bilateral secondary osteoarthritis of knee: Secondary | ICD-10-CM

## 2016-10-15 DIAGNOSIS — R0602 Shortness of breath: Secondary | ICD-10-CM | POA: Diagnosis not present

## 2016-10-15 DIAGNOSIS — Z1389 Encounter for screening for other disorder: Secondary | ICD-10-CM

## 2016-10-15 DIAGNOSIS — Z1211 Encounter for screening for malignant neoplasm of colon: Secondary | ICD-10-CM

## 2016-10-15 DIAGNOSIS — Z0289 Encounter for other administrative examinations: Secondary | ICD-10-CM

## 2016-10-15 NOTE — Progress Notes (Signed)
Office: [phone]  /  Fax: [phone]   HPI:   Chief Complaint: OBESITY  Lauren Lambert (MR# [id number]) is a 61 y.o. female who presents on 10/15/2016 for obesity evaluation and treatment. Current BMI is Body mass index is 46.77 kg/m.Lauren Lambert has struggled with obesity for years and has been unsuccessful in either losing weight or maintaining long term weight loss. Lauren Lambert attended our information session and states she is currently in the action stage of change and ready to dedicate time achieving and maintaining a healthier weight.  Lauren Lambert states her family eats meals together she thinks her family will eat healthier with  her her desired weight is 150 her heaviest weight ever was approaching 300 lbs. she has significant food cravings issues  she snacks frequently in the evenings she is frequently drinking liquids with calories she frequently makes poor food choices she has problems with excessive hunger  she frequently eats larger portions than normal  she has binge eating behaviors   Fatigue Lauren Lambert feels her energy is lower than it should be. This has worsened with weight gain and has not worsened recently. Lauren Lambert admits to daytime somnolence and  admits to waking up still tired. Patient is at risk for obstructive sleep apnea. Patent has a history of symptoms of daytime fatigue and hypertension. Patient generally gets 8 hours of sleep per night, and states they generally have restful sleep. Snoring is present. Apneic episodes are present. Epworth Sleepiness Score is 11  Dyspnea on exertion Lauren Lambert notes increasing shortness of breath with exercising and seems to be worsening over time with weight gain. She notes getting out of breath sooner with activity than she used to. This has not gotten worse recently. Lauren Lambert denies orthopnea.  Hypertension Lauren Lambert is a 61 y.o. female with hypertension.  Lauren Lambert denies chest Lambert or headache. She is  working weight loss to help control her blood pressure with the goal of decreasing her risk of heart attack and stroke. Lauren Lambert blood pressure is currently controlled on medications.  Osteoarthritis of both knees Lauren Lambert wants to lose weight to help her knee Lambert . She is getting ready to start stem cell injections with an orthopaedic physician.   Depression Screen Lauren Lambert Food and Mood (modified PHQ-9) score was  Depression screen PHQ 2/9 10/15/2016  Decreased Interest 3  Down, Depressed, Hopeless 2  PHQ - 2 Score 5  Altered sleeping 2  Tired, decreased energy 3  Change in appetite 3  Feeling bad or failure about yourself  2  Trouble concentrating 2  Moving slowly or fidgety/restless 3  Suicidal thoughts 1  PHQ-9 Score 21    ALLERGIES: Allergies  Allergen Reactions  . Adhesive [Tape]   . Aspirin     Nose bleeds  . Ciprofloxacin Other (See Comments)    Brown urine, throat itches  . Codeine Nausea Only    Unknown  . Hydrocodone-Acetaminophen Other (See Comments)    Unknown  . Latex     MEDICATIONS: Current Outpatient Prescriptions on File Prior to Visit  Medication Sig Dispense Refill  . losartan-hydrochlorothiazide (HYZAAR) 100-12.5 MG tablet Take 1 tablet by mouth daily. 90 tablet 2  . Multiple Vitamin (MULTIVITAMIN) tablet Take 1 tablet by mouth daily.    . Probiotic Product (ALIGN) 4 MG CAPS Take by mouth.    . doxycycline (VIBRA-TABS) 100 MG tablet Take 1 tablet (100 mg total) by mouth 2 (two) times daily. (Patient not taking: Reported on 10/15/2016) 20 tablet 0  .  predniSONE (DELTASONE) 5 MG tablet 6,5,4,3,2,1,done (Patient not taking: Reported on 10/15/2016) 21 tablet 0   No current facility-administered medications on file prior to visit.     PAST MEDICAL HISTORY: Past Medical History:  Diagnosis Date  . Back Lambert   . GERD (gastroesophageal reflux disease)   . Heart valve problem   . Hydatid cyst    Right  . Hypertension   . Hypothyroidism   . Joint  Lambert   . Obesity   . Rheumatoid arthritis ([place])   . Sleep apnea   . Swallowing difficulty   . Swelling     PAST SURGICAL HISTORY: Past Surgical History:  Procedure Laterality Date  . ABDOMINAL HYSTERECTOMY    . DILATION AND CURETTAGE OF UTERUS    . EYE SURGERY    . FOOT SURGERY    . HERNIA REPAIR    . HYSTEROSCOPY    . MOUTH SURGERY    . PELVIC LAPAROSCOPY      SOCIAL HISTORY: Social History  Substance Use Topics  . Smoking status: Never Smoker  . Smokeless tobacco: Never Used  . Alcohol use 3.0 oz/week    6 Standard drinks or equivalent per week    FAMILY HISTORY: Family History  Problem Relation Age of Onset  . Hypertension Mother   . Heart disease Mother   . Obesity Mother   . Hypertension Father   . Heart disease Father   . Obesity Father   . Breast cancer Maternal Aunt        Age 68's  . Colon cancer Maternal Grandmother   . Uterine cancer Maternal Aunt     ROS: Review of Systems  Constitutional: Positive for malaise/fatigue.  HENT: Positive for congestion (nasal stuffiness), hearing loss, sinus Lambert and tinnitus.        Nasal discharge Difficult or Painful Swallowing Dry Mouth Invisalign Retainers  Eyes:       Vision Changes Floaters  Respiratory: Positive for cough (in [place]) and shortness of breath (with activity).   Cardiovascular: Negative for chest Lambert.  Musculoskeletal: Positive for back Lambert, joint Lambert, myalgias and neck Lambert.       Neck Stiffness Swollen Glands Red or Swollen Joints Knee Lambert  Skin: Positive for itching.       Hair or Nail Changes  Neurological: Positive for headaches.  Endo/Heme/Allergies:       Hay Fever Heat/Cold Intolerance  Psychiatric/Behavioral: The patient has insomnia.     PHYSICAL EXAM: Blood pressure 123/79, pulse 88, temperature 97.9 F (36.6 C), temperature source Oral, height 5\' 3"  (1.6 m), weight 264 lb (119.7 kg), SpO2 96 %. Body mass index is 46.77 kg/m. Physical Exam  Constitutional: She  is oriented to person, place, and time. She appears well-developed and well-nourished.  Cardiovascular: Normal rate.   Pulmonary/Chest: Effort normal.  Musculoskeletal: Normal range of motion.  Neurological: She is oriented to person, place, and time.  Skin: Skin is warm and dry.  Vitals reviewed.   RECENT LABS AND TESTS: BMET    Component Value Date/Time   NA 136 09/18/2016 0818   K 3.6 09/18/2016 0818   CL 101 09/18/2016 0818   CO2 27 09/18/2016 0818   GLUCOSE 95 09/18/2016 0818   BUN 20 09/18/2016 0818   CREATININE 0.83 09/18/2016 0818   CALCIUM 9.3 09/18/2016 0818   Lab Results  Component Value Date   HGBA1C 6.1 09/18/2016   No results found for: INSULIN CBC    Component Value Date/Time   WBC  7.8 09/18/2016 0818   RBC 5.14 (H) 09/18/2016 0818   HGB 14.4 09/18/2016 0818   HCT 42.9 09/18/2016 0818   PLT 289.0 09/18/2016 0818   MCV 83.5 09/18/2016 0818   MCHC 33.6 09/18/2016 0818   RDW 15.3 09/18/2016 0818   Iron/TIBC/Ferritin/ %Sat No results found for: IRON, TIBC, FERRITIN, IRONPCTSAT Lipid Panel     Component Value Date/Time   CHOL 173 09/18/2016 0818   TRIG 93.0 09/18/2016 0818   HDL 49.80 09/18/2016 0818   CHOLHDL 3 09/18/2016 0818   VLDL 18.6 09/18/2016 0818   LDLCALC 105 (H) 09/18/2016 0818   Hepatic Function Panel     Component Value Date/Time   PROT 7.3 09/18/2016 0818   ALBUMIN 4.3 09/18/2016 0818   AST 14 09/18/2016 0818   ALT 13 09/18/2016 0818   ALKPHOS 78 09/18/2016 0818   BILITOT 0.8 09/18/2016 0818      Component Value Date/Time   TSH 2.86 09/18/2016 0818    ECG  shows NSR with a rate of 90 BPM INDIRECT CALORIMETER done today shows a VO2 of 368 and a REE of 2558.    ASSESSMENT AND PLAN: Other fatigue - Plan: EKG 12-Lead, Vitamin B12, CBC With Differential, Comprehensive metabolic panel, Lipid Panel With LDL/HDL Ratio, Insulin, random, T3, T4, free, TSH, Hemoglobin A1c, VITAMIN D 25 Hydroxy (Vit-D Deficiency, Fractures), Anemia  panel, Folate  Shortness of breath on exertion  Essential hypertension  Other secondary osteoarthritis of both knees  Depression screening  Morbid obesity (HCC)  PLAN:  Fatigue Lauren Lambert was informed that her fatigue may be related to obesity, depression or many other causes. Labs will be ordered, and in the meanwhile Lauren Lambert has agreed to work on diet, exercise and weight loss to help with fatigue. Proper sleep hygiene was discussed including the need for 7-8 hours of quality sleep each night. A sleep study was not ordered based on symptoms and Epworth score.  Dyspnea on exertion Lauren Lambert shortness of breath appears to be obesity related and exercise induced. She has agreed to work on weight loss and gradually increase exercise to treat her exercise induced shortness of breath. If Jull follows our instructions and loses weight without improvement of her shortness of breath, we will plan to refer to pulmonology. We will monitor this condition regularly. Lauren Lambert agrees to this plan.  Hypertension We discussed sodium restriction, working on healthy weight loss, and a regular exercise program as the means to achieve improved blood pressure control. Lauren Lambert agreed with this plan and agreed to follow up as directed. We will check labs and we will continue to monitor her blood pressure as well as her progress with the above lifestyle modifications. She will watch for signs of hypotension as she continues her lifestyle modifications.  Osteoarthritis of both knees Lauren Lambert agrees to work on weight loss and exercise to help her knee Lambert and will follow up with our clinic in 2 weeks.  Depression Screen Lauren Lambert had a strongly positive depression screening. Depression is commonly associated with obesity and often results in emotional eating behaviors. We will monitor this closely and work on CBT to help improve the non-hunger eating patterns. Referral to Psychology may be required if no  improvement is seen as she continues in our clinic.  Obesity Jozie is currently in the action stage of change and her goal is to continue with weight loss efforts She has agreed to follow the Category 3 plan Lauren Lambert has been instructed to work up to a goal of  150 minutes of combined cardio and strengthening exercise per week for weight loss and overall health benefits. We discussed the following Behavioral Modification Strategies today: increasing lean protein intake and meal planning & cooking strategies  Lauren Lambert has agreed to follow up with our clinic in 2 weeks. She was informed of the importance of frequent follow up visits to maximize her success with intensive lifestyle modifications for her multiple health conditions. She was informed we would discuss her lab results at her next visit unless there is a critical issue that needs to be addressed sooner. Lauren Lambert agreed to keep her next visit at the agreed upon time to discuss these results.  I, Doreene Nest, am acting as scribe for Dennard Nip, MD  I have reviewed the above documentation for accuracy and completeness, and I agree with the above. -Dennard Nip, MD  OBESITY BEHAVIORAL INTERVENTION VISIT  Today's visit was # 1 out of 22.  Starting weight: 264 Starting date: 10/15/16 Today's weight : 264  Today's date: 10/15/2016 Total lbs lost to date: 0 (Patients must lose 7 lbs in the first 6 months to continue with counseling)   ASK: We discussed the diagnosis of obesity with Lauren Lambert today and Lauren Lambert agreed to give [place] permission to discuss obesity behavioral modification therapy today.  ASSESS: Lauren Lambert has the diagnosis of obesity and her BMI today is 62.9 Lauren Lambert is in the action stage of change   ADVISE: Ijeoma was educated on the multiple health risks of obesity as well as the benefit of weight loss to improve her health. She was advised of the need for long term treatment and the importance of  lifestyle modifications.  AGREE: Multiple dietary modification options and treatment options were discussed and  Gavriela agreed to follow the Category 3 plan We discussed the following Behavioral Modification Strategies today: increasing lean protein intake and meal planning & cooking strategies

## 2016-10-15 NOTE — Progress Notes (Signed)
Subjective:    Lauren Lambert is a 61 y.o. female and is here for a comprehensive physical exam.  Lauren Lambert CMA acting as scribe for Dr. Juleen China.  HPI: No concerns. Due for colonoscopy.  Health Maintenance Due  Topic Date Due  . Hepatitis C Screening  January 07, 1956  . HIV Screening  08/06/1972  . TETANUS/TDAP  08/06/1976  . MAMMOGRAM  08/07/2007  . COLONOSCOPY  08/07/2007    PMHx, SurgHx, SocialHx, Medications, and Allergies were reviewed in the Visit Navigator and updated as appropriate.   Past Medical History:  Diagnosis Date  . Back pain   . GERD (gastroesophageal reflux disease)   . Heart valve problem   . Hydatid cyst    Right  . Hypertension   . Hypothyroidism   . Joint pain   . Obesity   . Rheumatoid arthritis ([place])   . Sleep apnea   . Swallowing difficulty   . Swelling     Past Surgical History:  Procedure Laterality Date  . ABDOMINAL HYSTERECTOMY    . DILATION AND CURETTAGE OF UTERUS    . EYE SURGERY    . FOOT SURGERY    . HERNIA REPAIR    . HYSTEROSCOPY    . MOUTH SURGERY    . PELVIC LAPAROSCOPY      Family History  Problem Relation Age of Onset  . Hypertension Mother   . Heart disease Mother   . Obesity Mother   . Hypertension Father   . Heart disease Father   . Obesity Father   . Breast cancer Maternal Aunt        Age 64's  . Colon cancer Maternal Grandmother   . Uterine cancer Maternal Aunt    Social History  Substance Use Topics  . Smoking status: Never Smoker  . Smokeless tobacco: Never Used  . Alcohol use 3.0 oz/week    6 Standard drinks or equivalent per week    Review of Systems:   Review of Systems  Constitutional: Negative for chills, fever and malaise/fatigue.  HENT: Negative for ear pain, sinus pain and sore throat.   Eyes: Negative for blurred vision and double vision.  Respiratory: Negative for cough, shortness of breath and wheezing.   Cardiovascular: Negative for chest pain, palpitations and leg swelling.    Gastrointestinal: Negative for abdominal pain, nausea and vomiting.  Musculoskeletal: Positive for joint pain.       Chronic.   Neurological: Negative for dizziness and headaches.  Psychiatric/Behavioral: Negative for depression, hallucinations and memory loss.    Objective:   BP 114/80   Pulse 85   Temp 98 F (36.7 C) (Oral)   Ht 5\' 3"  (1.6 m)   Wt 270 lb (122.5 kg)   SpO2 98%   BMI 47.83 kg/m  Body mass index is 47.83 kg/m.   General Appearance:    Alert, cooperative, no distress, appears stated age  Head:    Normocephalic, without obvious abnormality, atraumatic  Eyes:    PERRL, conjunctiva/corneas clear, EOM's intact, fundi    benign, both eyes  Ears:    Normal TM's and external ear canals, both ears  Nose:   Nares normal, septum midline, mucosa normal, no drainage    or sinus tenderness  Throat:   Lips, mucosa, and tongue normal; teeth and gums normal  Neck:   Supple, symmetrical, trachea midline, no adenopathy;    thyroid:  no enlargement/tenderness/nodules; no carotid   bruit or JVD  Back:     Symmetric,  no curvature, ROM normal, no CVA tenderness  Lungs:     Clear to auscultation bilaterally, respirations unlabored  Chest Wall:    No tenderness or deformity   Heart:    Regular rate and rhythm, S1 and S2 normal, no murmur, rub   or gallop  Abdomen:     Soft, non-tender, bowel sounds active all four quadrants,    no masses, no organomegaly  Extremities:   Extremities normal, atraumatic, no cyanosis or edema  Pulses:   2+ and symmetric all extremities  Skin:   Skin color, texture, turgor normal, no rashes or lesions  Lymph nodes:   Cervical, supraclavicular, and axillary nodes normal  Neurologic:   CNII-XII intact, normal strength, sensation and reflexes    throughout    Assessment/Plan:   Lauren Lambert was seen today for annual exam.  Diagnoses and all orders for this visit:  Routine physical examination  Screening for colon cancer -     Ambulatory referral  to Gastroenterology    Patient Counseling: [x]    Nutrition: Stressed importance of moderation in sodium/caffeine intake, saturated fat and cholesterol, caloric balance, sufficient intake of fresh fruits, vegetables, fiber, calcium, iron, and 1 mg of folate supplement per day (for females capable of pregnancy).  [x]    Stressed the importance of regular exercise.   [x]    Substance Abuse: Discussed cessation/primary prevention of tobacco, alcohol, or other drug use; driving or other dangerous activities under the influence; availability of treatment for abuse.   [x]    Injury prevention: Discussed safety belts, safety helmets, smoke detector, smoking near bedding or upholstery.   [x]    Sexuality: Discussed sexually transmitted diseases, partner selection, use of condoms, avoidance of unintended pregnancy  and contraceptive alternatives.  [x]    Dental health: Discussed importance of regular tooth brushing, flossing, and dental visits.  [x]    Health maintenance and immunizations reviewed. Please refer to Health maintenance section.   Briscoe Deutscher, DO [facility] [place] served as Education administrator during this visit. History, Physical, and Plan performed by medical provider. The above documentation has been reviewed and is accurate and complete. Briscoe Deutscher, D.O.

## 2016-10-17 DIAGNOSIS — K219 Gastro-esophageal reflux disease without esophagitis: Secondary | ICD-10-CM | POA: Insufficient documentation

## 2016-10-19 DIAGNOSIS — R5383 Other fatigue: Secondary | ICD-10-CM | POA: Diagnosis not present

## 2016-10-20 LAB — CBC WITH DIFFERENTIAL
Basophils Absolute: 0.1 10*3/uL (ref 0.0–0.2)
Basos: 1 %
EOS (ABSOLUTE): 0.2 10*3/uL (ref 0.0–0.4)
EOS: 2 %
HEMOGLOBIN: 14.2 g/dL (ref 11.1–15.9)
IMMATURE GRANULOCYTES: 0 %
Immature Grans (Abs): 0 10*3/uL (ref 0.0–0.1)
LYMPHS ABS: 2.2 10*3/uL (ref 0.7–3.1)
Lymphs: 26 %
MCH: 28.3 pg (ref 26.6–33.0)
MCHC: 32.9 g/dL (ref 31.5–35.7)
MCV: 86 fL (ref 79–97)
MONOCYTES: 5 %
Monocytes Absolute: 0.5 10*3/uL (ref 0.1–0.9)
NEUTROS ABS: 5.5 10*3/uL (ref 1.4–7.0)
Neutrophils: 66 %
RBC: 5.02 x10E6/uL (ref 3.77–5.28)
RDW: 15.6 % — AB (ref 12.3–15.4)
WBC: 8.4 10*3/uL (ref 3.4–10.8)

## 2016-10-20 LAB — ANEMIA PANEL
Ferritin: 189 ng/mL — ABNORMAL HIGH (ref 15–150)
Folate, Hemolysate: 593.2 ng/mL
Folate, RBC: 1376 ng/mL (ref >498)
Hematocrit: 43.1 % (ref 34.0–46.6)
IRON SATURATION: 22 % (ref 15–55)
IRON: 62 ug/dL (ref 27–159)
RETIC CT PCT: 1.2 % (ref 0.6–2.6)
TIBC: 283 ug/dL (ref 250–450)
UIBC: 221 ug/dL (ref 131–425)
Vitamin B-12: 772 pg/mL (ref 232–1245)

## 2016-10-20 LAB — LIPID PANEL WITH LDL/HDL RATIO
Cholesterol, Total: 226 mg/dL — ABNORMAL HIGH (ref 100–199)
HDL: 52 mg/dL (ref >39)
LDL Calculated: 151 mg/dL — ABNORMAL HIGH (ref 0–99)
LDL/HDL RATIO: 2.9 ratio (ref 0.0–3.2)
Triglycerides: 114 mg/dL (ref 0–149)
VLDL CHOLESTEROL CAL: 23 mg/dL (ref 5–40)

## 2016-10-20 LAB — T4, FREE: Free T4: 1.19 ng/dL (ref 0.82–1.77)

## 2016-10-20 LAB — COMPREHENSIVE METABOLIC PANEL
A/G RATIO: 1.6 (ref 1.2–2.2)
ALT: 16 IU/L (ref 0–32)
AST: 11 IU/L (ref 0–40)
Albumin: 4.3 g/dL (ref 3.5–5.5)
Alkaline Phosphatase: 90 IU/L (ref 39–117)
BUN/Creatinine Ratio: 28 — ABNORMAL HIGH (ref 9–23)
BUN: 23 mg/dL (ref 6–24)
Bilirubin Total: 0.5 mg/dL (ref 0.0–1.2)
CALCIUM: 9.2 mg/dL (ref 8.7–10.2)
CO2: 24 mmol/L (ref 20–29)
Chloride: 100 mmol/L (ref 96–106)
Creatinine, Ser: 0.82 mg/dL (ref 0.57–1.00)
GFR, EST AFRICAN AMERICAN: 91 mL/min/{1.73_m2} (ref >59)
GFR, EST NON AFRICAN AMERICAN: 79 mL/min/{1.73_m2} (ref >59)
GLOBULIN, TOTAL: 2.7 g/dL (ref 1.5–4.5)
Glucose: 113 mg/dL — ABNORMAL HIGH (ref 65–99)
POTASSIUM: 4.4 mmol/L (ref 3.5–5.2)
Sodium: 140 mmol/L (ref 134–144)
TOTAL PROTEIN: 7 g/dL (ref 6.0–8.5)

## 2016-10-20 LAB — VITAMIN D 25 HYDROXY (VIT D DEFICIENCY, FRACTURES): Vit D, 25-Hydroxy: 49.2 ng/mL (ref 30.0–100.0)

## 2016-10-20 LAB — TSH: TSH: 4.24 u[IU]/mL (ref 0.450–4.500)

## 2016-10-20 LAB — HEMOGLOBIN A1C
Est. average glucose Bld gHb Est-mCnc: 120 mg/dL
Hgb A1c MFr Bld: 5.8 % — ABNORMAL HIGH (ref 4.8–5.6)

## 2016-10-20 LAB — T3: T3, Total: 106 ng/dL (ref 71–180)

## 2016-10-20 LAB — VITAMIN B12: Vitamin B-12: 751 pg/mL (ref 232–1245)

## 2016-10-20 LAB — FOLATE: Folate: >20 ng/mL (ref >3.0)

## 2016-10-20 LAB — INSULIN, RANDOM: INSULIN: 24.1 u[IU]/mL (ref 2.6–24.9)

## 2016-10-29 ENCOUNTER — Ambulatory Visit (INDEPENDENT_AMBULATORY_CARE_PROVIDER_SITE_OTHER): Payer: BLUE CROSS/BLUE SHIELD | Admitting: Family Medicine

## 2016-10-29 VITALS — BP 129/78 | HR 88 | Temp 98.0°F | Ht 63.0 in | Wt 260.0 lb

## 2016-10-29 DIAGNOSIS — R7303 Prediabetes: Secondary | ICD-10-CM

## 2016-10-29 DIAGNOSIS — Z9189 Other specified personal risk factors, not elsewhere classified: Secondary | ICD-10-CM

## 2016-10-29 NOTE — Progress Notes (Signed)
Office: [phone]  /  Fax: [phone]   HPI:   Chief Complaint: OBESITY Lauren Lambert is here to discuss her progress with her obesity treatment plan. She is on the  follow the Category 3 plan and is following her eating plan approximately 100 % of the time. She states she is swimming/walking 60 minutes 2 times per week. Lauren Lambert has done very well with weight loss but didn't like the lunch options. Hunger was controlled. Shyniece had to get used to planning meals ahead of time. Her weight is 260 lb (117.9 kg) today and has had a weight loss of 10 pounds over a period of 2 to 3 weeks since her last visit. She has lost 5 lbs since starting treatment with [place].  Pre-Diabetes Lauren Lambert has a diagnosis of pre-diabetes based on her elevated Hgb A1c at 5.8 but improved from last labs. She was informed this puts her at greater risk of developing diabetes. She is not taking metformin currently and continues to work on diet and exercise to decrease risk of diabetes. She denies nausea or hypoglycemia. Polyphagia is controlled on low simple carbohydrate diet.  At risk for diabetes Lauren Lambert is at higher than average risk for developing diabetes due to her obesity. She currently denies polyuria or polydipsia.  ALLERGIES: Allergies  Allergen Reactions  . Adhesive [Tape]   . Aspirin     Nose bleeds  . Ciprofloxacin Other (See Comments)    Brown urine, throat itches  . Codeine Nausea Only    Unknown  . Hydrocodone-Acetaminophen Other (See Comments)    Unknown  . Latex     MEDICATIONS: Current Outpatient Prescriptions on File Prior to Visit  Medication Sig Dispense Refill  . albuterol (PROVENTIL) (2.5 MG/3ML) 0.083% nebulizer solution Take 2.5 mg by nebulization every 6 (six) hours as needed for wheezing or shortness of breath.    . losartan-hydrochlorothiazide (HYZAAR) 100-12.5 MG tablet Take 1 tablet by mouth daily. 90 tablet 2  . Multiple Vitamin (MULTIVITAMIN) tablet Take 1 tablet by mouth  daily.    . Probiotic Product (ALIGN) 4 MG CAPS Take by mouth.     No current facility-administered medications on file prior to visit.     PAST MEDICAL HISTORY: Past Medical History:  Diagnosis Date  . Back pain   . GERD (gastroesophageal reflux disease)   . Heart valve problem   . Hydatid cyst    Right  . Hypertension   . Hypothyroidism   . Joint pain   . Obesity   . Rheumatoid arthritis ([place])   . Sleep apnea   . Swallowing difficulty   . Swelling     PAST SURGICAL HISTORY: Past Surgical History:  Procedure Laterality Date  . ABDOMINAL HYSTERECTOMY    . DILATION AND CURETTAGE OF UTERUS    . EYE SURGERY    . FOOT SURGERY    . HERNIA REPAIR    . HYSTEROSCOPY    . MOUTH SURGERY    . PELVIC LAPAROSCOPY      SOCIAL HISTORY: Social History  Substance Use Topics  . Smoking status: Never Smoker  . Smokeless tobacco: Never Used  . Alcohol use 3.0 oz/week    6 Standard drinks or equivalent per week    FAMILY HISTORY: Family History  Problem Relation Age of Onset  . Hypertension Mother   . Heart disease Mother   . Obesity Mother   . Hypertension Father   . Heart disease Father   . Obesity Father   . Breast  cancer Maternal Aunt        Age 47's  . Colon cancer Maternal Grandmother   . Uterine cancer Maternal Aunt     ROS: Review of Systems  Constitutional: Positive for weight loss.  Gastrointestinal: Negative for nausea and vomiting.  Genitourinary: Negative for frequency.  Endo/Heme/Allergies: Negative for polydipsia.       Polyphagia Negative hypoglycemia    PHYSICAL EXAM: Blood pressure 129/78, pulse 88, temperature 98 F (36.7 C), temperature source Oral, height 5\' 3"  (1.6 m), weight 260 lb (117.9 kg), SpO2 97 %. Body mass index is 46.06 kg/m. Physical Exam  Constitutional: She is oriented to person, place, and time. She appears well-developed and well-nourished.  Cardiovascular: Normal rate.   Pulmonary/Chest: Effort normal.  Musculoskeletal:  Normal range of motion.  Neurological: She is oriented to person, place, and time.  Skin: Skin is warm and dry.  Psychiatric: She has a normal mood and affect. Her behavior is normal.  Vitals reviewed.   RECENT LABS AND TESTS: BMET    Component Value Date/Time   NA 140 10/19/2016 0836   K 4.4 10/19/2016 0836   CL 100 10/19/2016 0836   CO2 24 10/19/2016 0836   GLUCOSE 113 (H) 10/19/2016 0836   GLUCOSE 95 09/18/2016 0818   BUN 23 10/19/2016 0836   CREATININE 0.82 10/19/2016 0836   CALCIUM 9.2 10/19/2016 0836   GFRNONAA 79 10/19/2016 0836   GFRAA 91 10/19/2016 0836   Lab Results  Component Value Date   HGBA1C 5.8 (H) 10/19/2016   HGBA1C 6.1 09/18/2016   Lab Results  Component Value Date   INSULIN 24.1 10/19/2016   CBC    Component Value Date/Time   WBC 8.4 10/19/2016 0836   WBC 7.8 09/18/2016 0818   RBC 5.02 10/19/2016 0836   RBC 5.14 (H) 09/18/2016 0818   HGB 14.2 10/19/2016 0836   HCT 43.1 10/19/2016 0836   PLT 289.0 09/18/2016 0818   MCV 86 10/19/2016 0836   MCH 28.3 10/19/2016 0836   MCHC 32.9 10/19/2016 0836   MCHC 33.6 09/18/2016 0818   RDW 15.6 (H) 10/19/2016 0836   LYMPHSABS 2.2 10/19/2016 0836   EOSABS 0.2 10/19/2016 0836   BASOSABS 0.1 10/19/2016 0836   Iron/TIBC/Ferritin/ %Sat    Component Value Date/Time   IRON 62 10/19/2016 0836   TIBC 283 10/19/2016 0836   FERRITIN 189 (H) 10/19/2016 0836   IRONPCTSAT 22 10/19/2016 0836   Lipid Panel     Component Value Date/Time   CHOL 226 (H) 10/19/2016 0836   TRIG 114 10/19/2016 0836   HDL 52 10/19/2016 0836   CHOLHDL 3 09/18/2016 0818   VLDL 18.6 09/18/2016 0818   LDLCALC 151 (H) 10/19/2016 0836   Hepatic Function Panel     Component Value Date/Time   PROT 7.0 10/19/2016 0836   ALBUMIN 4.3 10/19/2016 0836   AST 11 10/19/2016 0836   ALT 16 10/19/2016 0836   ALKPHOS 90 10/19/2016 0836   BILITOT 0.5 10/19/2016 0836      Component Value Date/Time   TSH 4.240 10/19/2016 0836   TSH 2.86  09/18/2016 0818    ASSESSMENT AND PLAN: Prediabetes  At risk for diabetes mellitus  Morbid obesity ([place])  PLAN:  Pre-Diabetes Lauren Lambert will continue to work on weight loss, exercise, and decreasing simple carbohydrates in her diet to help decrease the risk of diabetes. We dicussed metformin including benefits and risks. She was informed that eating too many simple carbohydrates or too many calories at one  sitting increases the likelihood of GI side effects. Lauren Lambert declined metformin for now and a prescription was not written today. Lauren Lambert agreed to follow up with [place] as directed to monitor her progress.  Diabetes risk counselling Lauren Lambert was given extended (at least 15 minutes) diabetes prevention counseling today. She is 61 y.o. female and has risk factors for diabetes including obesity and pre-diabetes. We discussed intensive lifestyle modifications today with an emphasis on weight loss as well as increasing exercise and decreasing simple carbohydrates in her diet.  We spent > than 50% of the 30 minute visit on the counseling as documented in the note.  Obesity Lauren Lambert is currently in the action stage of change. As such, her goal is to continue with weight loss efforts She has agreed to follow the Category 3 plan with vegetarian lunch options Lauren Lambert has been instructed to work up to a goal of 150 minutes of combined cardio and strengthening exercise per week for weight loss and overall health benefits. We discussed the following Behavioral Modification Strategies today: increase H2O intake, increasing lean protein intake, decreasing simple carbohydrates  and decreasing sodium intake  Lauren Lambert has agreed to follow up with our clinic in 2 to 3 weeks. She was informed of the importance of frequent follow up visits to maximize her success with intensive lifestyle modifications for her multiple health conditions.  I, Doreene Nest, am acting as transcriptionist for Dennard Nip,  MD  I have reviewed the above documentation for accuracy and completeness, and I agree with the above. -Dennard Nip, MD  OBESITY BEHAVIORAL INTERVENTION VISIT  Today's visit was # 2 out of 46.  Starting weight: 270 lbs Starting date: 10/15/16 Today's weight : 260 lbs Today's date: 10/29/2016 Total lbs lost to date: 10 (Patients must lose 7 lbs in the first 6 months to continue with counseling)   ASK: We discussed the diagnosis of obesity with Lauren Lambert today and Lauren Lambert agreed to give [place] permission to discuss obesity behavioral modification therapy today.  ASSESS: Lauren Lambert has the diagnosis of obesity and her BMI today is 61.2 Lauren Lambert is in the action stage of change   ADVISE: Lauren Lambert was educated on the multiple health risks of obesity as well as the benefit of weight loss to improve her health. She was advised of the need for long term treatment and the importance of lifestyle modifications.  AGREE: Multiple dietary modification options and treatment options were discussed and  Lauren Lambert agreed to follow the Category 3 plan with vegetarian lunch options We discussed the following Behavioral Modification Strategies today: increase H2O intake, increasing lean protein intake, decreasing simple carbohydrates  and decreasing sodium intake

## 2016-11-05 ENCOUNTER — Encounter (INDEPENDENT_AMBULATORY_CARE_PROVIDER_SITE_OTHER): Payer: Self-pay | Admitting: Family Medicine

## 2016-11-16 ENCOUNTER — Encounter (INDEPENDENT_AMBULATORY_CARE_PROVIDER_SITE_OTHER): Payer: Self-pay

## 2016-11-16 ENCOUNTER — Ambulatory Visit (INDEPENDENT_AMBULATORY_CARE_PROVIDER_SITE_OTHER): Payer: BLUE CROSS/BLUE SHIELD | Admitting: Physician Assistant

## 2016-11-18 ENCOUNTER — Ambulatory Visit (INDEPENDENT_AMBULATORY_CARE_PROVIDER_SITE_OTHER): Payer: BLUE CROSS/BLUE SHIELD | Admitting: Physician Assistant

## 2016-11-18 VITALS — BP 134/80 | HR 81 | Temp 98.2°F | Ht 63.0 in | Wt 260.0 lb

## 2016-11-18 DIAGNOSIS — Z6841 Body Mass Index (BMI) 40.0 and over, adult: Secondary | ICD-10-CM

## 2016-11-18 DIAGNOSIS — Z9189 Other specified personal risk factors, not elsewhere classified: Secondary | ICD-10-CM | POA: Diagnosis not present

## 2016-11-18 DIAGNOSIS — I10 Essential (primary) hypertension: Secondary | ICD-10-CM

## 2016-11-18 DIAGNOSIS — E669 Obesity, unspecified: Secondary | ICD-10-CM | POA: Diagnosis not present

## 2016-11-18 DIAGNOSIS — R7303 Prediabetes: Secondary | ICD-10-CM

## 2016-11-18 DIAGNOSIS — IMO0001 Reserved for inherently not codable concepts without codable children: Secondary | ICD-10-CM

## 2016-11-18 NOTE — Progress Notes (Signed)
Office: [phone]  /  Fax: [phone]   HPI:   Chief Complaint: OBESITY Lauren Lambert is here to discuss her progress with her obesity treatment plan. She is on the follow the Category 3 plan and is following her eating plan approximately 75 % of the time. She states she is exercising 60 minutes 2-3 times per week. Lauren Lambert maintained her weight. She will be traveling out of state for 4 months in the near future, and would like to know travel eating strategies. Her weight is 260 lb (117.9 kg) today and has not lost weight since her last visit. She has lost 10 lbs since starting treatment with [place].  Pre-Diabetes Lauren Lambert has a diagnosis of prediabetes based on her elevated HgA1c and was informed this puts her at greater risk of developing diabetes. She declined taking metformin currently and continues to work on diet and exercise to decrease risk of diabetes. She denies nausea or hypoglycemia.  Hypertension Lauren Lambert is a 61 y.o. female with hypertension.  Lauren Lambert denies chest pain or shortness of breath on exertion. She is working weight loss to help control her blood pressure with the goal of decreasing her risk of heart attack and stroke. Lauren Lambert's repeat blood pressure is134/80 and is thus controlled.  ALLERGIES: Allergies  Allergen Reactions  . Adhesive [Tape]   . Aspirin     Nose bleeds  . Ciprofloxacin Other (See Comments)    Brown urine, throat itches  . Codeine Nausea Only    Unknown  . Hydrocodone-Acetaminophen Other (See Comments)    Unknown  . Latex     MEDICATIONS: Current Outpatient Prescriptions on File Prior to Visit  Medication Sig Dispense Refill  . albuterol (PROVENTIL) (2.5 MG/3ML) 0.083% nebulizer solution Take 2.5 mg by nebulization every 6 (six) hours as needed for wheezing or shortness of breath.    . losartan-hydrochlorothiazide (HYZAAR) 100-12.5 MG tablet Take 1 tablet by mouth daily. 90 tablet 2  . Multiple Vitamin (MULTIVITAMIN)  tablet Take 1 tablet by mouth daily.    . Probiotic Product (ALIGN) 4 MG CAPS Take by mouth.     No current facility-administered medications on file prior to visit.     PAST MEDICAL HISTORY: Past Medical History:  Diagnosis Date  . Back pain   . GERD (gastroesophageal reflux disease)   . Heart valve problem   . Hydatid cyst    Right  . Hypertension   . Hypothyroidism   . Joint pain   . Obesity   . Rheumatoid arthritis ([place])   . Sleep apnea   . Swallowing difficulty   . Swelling     PAST SURGICAL HISTORY: Past Surgical History:  Procedure Laterality Date  . ABDOMINAL HYSTERECTOMY    . DILATION AND CURETTAGE OF UTERUS    . EYE SURGERY    . FOOT SURGERY    . HERNIA REPAIR    . HYSTEROSCOPY    . MOUTH SURGERY    . PELVIC LAPAROSCOPY      SOCIAL HISTORY: Social History  Substance Use Topics  . Smoking status: Never Smoker  . Smokeless tobacco: Never Used  . Alcohol use 3.0 oz/week    6 Standard drinks or equivalent per week    FAMILY HISTORY: Family History  Problem Relation Age of Onset  . Hypertension Mother   . Heart disease Mother   . Obesity Mother   . Hypertension Father   . Heart disease Father   . Obesity Father   . Breast  cancer Maternal Aunt        Age 9's  . Colon cancer Maternal Grandmother   . Uterine cancer Maternal Aunt     ROS: Review of Systems  Respiratory: Negative for shortness of breath.   Cardiovascular: Negative for chest pain.  Gastrointestinal: Negative for heartburn and nausea.  Musculoskeletal:       Negative muscle weakness  Neurological: Negative for headaches.    PHYSICAL EXAM: Blood pressure 134/80, pulse 81, temperature 98.2 F (36.8 C), temperature source Oral, height 5\' 3"  (1.6 m), weight 260 lb (117.9 kg), SpO2 99 %. Body mass index is 46.06 kg/m. Physical Exam  Constitutional: She is oriented to person, place, and time. She appears well-developed and well-nourished.  Cardiovascular: Normal rate.     Pulmonary/Chest: Effort normal.  Musculoskeletal: Normal range of motion.  Neurological: She is alert and oriented to person, place, and time.  Skin: Skin is warm and dry.    RECENT LABS AND TESTS: BMET    Component Value Date/Time   NA 140 10/19/2016 0836   K 4.4 10/19/2016 0836   CL 100 10/19/2016 0836   CO2 24 10/19/2016 0836   GLUCOSE 113 (H) 10/19/2016 0836   GLUCOSE 95 09/18/2016 0818   BUN 23 10/19/2016 0836   CREATININE 0.82 10/19/2016 0836   CALCIUM 9.2 10/19/2016 0836   GFRNONAA 79 10/19/2016 0836   GFRAA 91 10/19/2016 0836   Lab Results  Component Value Date   HGBA1C 5.8 (H) 10/19/2016   HGBA1C 6.1 09/18/2016   Lab Results  Component Value Date   INSULIN 24.1 10/19/2016   CBC    Component Value Date/Time   WBC 8.4 10/19/2016 0836   WBC 7.8 09/18/2016 0818   RBC 5.02 10/19/2016 0836   RBC 5.14 (H) 09/18/2016 0818   HGB 14.2 10/19/2016 0836   HCT 43.1 10/19/2016 0836   PLT 289.0 09/18/2016 0818   MCV 86 10/19/2016 0836   MCH 28.3 10/19/2016 0836   MCHC 32.9 10/19/2016 0836   MCHC 33.6 09/18/2016 0818   RDW 15.6 (H) 10/19/2016 0836   LYMPHSABS 2.2 10/19/2016 0836   EOSABS 0.2 10/19/2016 0836   BASOSABS 0.1 10/19/2016 0836   Iron/TIBC/Ferritin/ %Sat    Component Value Date/Time   IRON 62 10/19/2016 0836   TIBC 283 10/19/2016 0836   FERRITIN 189 (H) 10/19/2016 0836   IRONPCTSAT 22 10/19/2016 0836   Lipid Panel     Component Value Date/Time   CHOL 226 (H) 10/19/2016 0836   TRIG 114 10/19/2016 0836   HDL 52 10/19/2016 0836   CHOLHDL 3 09/18/2016 0818   VLDL 18.6 09/18/2016 0818   LDLCALC 151 (H) 10/19/2016 0836   Hepatic Function Panel     Component Value Date/Time   PROT 7.0 10/19/2016 0836   ALBUMIN 4.3 10/19/2016 0836   AST 11 10/19/2016 0836   ALT 16 10/19/2016 0836   ALKPHOS 90 10/19/2016 0836   BILITOT 0.5 10/19/2016 0836      Component Value Date/Time   TSH 4.240 10/19/2016 0836   TSH 2.86 09/18/2016 0818    ASSESSMENT  AND PLAN: Prediabetes  Essential hypertension  At risk for diabetes mellitus  Class 3 obesity with serious comorbidity and body mass index (BMI) of 45.0 to 49.9 in adult, unspecified obesity type (Lauren Lambert)  PLAN:  Pre-Diabetes Lauren Lambert will continue to work on weight loss, exercise, and decreasing simple carbohydrates in her diet to help decrease the risk of diabetes. We dicussed metformin including benefits and risks. She  was informed that eating too many simple carbohydrates or too many calories at one sitting increases the likelihood of GI side effects. Lauren Lambert declined metformin for now and a prescription was not written today. Lauren Lambert agreed to follow up with [place] as directed to monitor her progress.   Hypertension We discussed sodium restriction, working on healthy weight loss, and a regular exercise program as the means to achieve improved blood pressure control. Lauren Lambert agreed with this plan and agreed to follow up as directed. We will continue to monitor her blood pressure as well as her progress with the above lifestyle modifications. She will continue her medications as prescribed and will watch for signs of hypotension as she continues her lifestyle modifications.  Obesity  Lauren Lambert is currently in the action stage of change. As such, her goal is to continue with weight loss efforts She has agreed to keep a food journal with 1600 calories and 100 protein  Lauren Lambert has been instructed to work up to a goal of 150 minutes of combined cardio and strengthening exercise per week for weight loss and overall health benefits. We discussed the following Behavioral Modification Stratagies today: increasing lean protein intake and keep a strict food journal.   Lauren Lambert has agreed to follow up with our clinic in 3 weeks. She was informed of the importance of frequent follow up visits to maximize her success with intensive lifestyle modifications for her multiple health conditions.   Office:  [phone]  /  Fax: [phone]  OBESITY BEHAVIORAL INTERVENTION VISIT  Today's visit was # 3 out of 22.  Starting weight: 270 Starting date: 10/15/16 Today's weight : Weight: 260 lb (117.9 kg)  Today's date: 11/26/2016 Total lbs lost to date: 10 (Patients must lose 7 lbs in the first 6 months to continue with counseling)   ASK: We discussed the diagnosis of obesity with Lauren Lambert today and Lauren Lambert agreed to give [place] permission to discuss obesity behavioral modification therapy today.  ASSESS: Lauren Lambert has the diagnosis of obesity and her BMI today is 71.2 Lauren Lambert is in the action stage of change   ADVISE: Lauren Lambert was educated on the multiple health risks of obesity as well as the benefit of weight loss to improve her health. She was advised of the need for long term treatment and the importance of lifestyle modifications.  AGREE: Multiple dietary modification options and treatment options were discussed and  Lauren Lambert agreed to keep a food journal with 1600 calories and 100+g protein  We discussed the following Behavioral Modification Stratagies today: increasing lean protein intake and keeping a strict food journal.  We spent > than 50% of the 15 minute visit on the counseling as documented in the note.    I have reviewed the above documentation for accuracy and completeness, and I agree with the above. -Lacy Duverney, PA-C  I have reviewed the above note and agree with the plan. -Dennard Nip, MD

## 2016-12-09 ENCOUNTER — Ambulatory Visit (INDEPENDENT_AMBULATORY_CARE_PROVIDER_SITE_OTHER): Payer: BLUE CROSS/BLUE SHIELD | Admitting: Physician Assistant

## 2016-12-09 VITALS — BP 128/81 | HR 87 | Temp 97.9°F | Ht 63.0 in | Wt 256.0 lb

## 2016-12-09 DIAGNOSIS — I10 Essential (primary) hypertension: Secondary | ICD-10-CM | POA: Diagnosis not present

## 2016-12-09 DIAGNOSIS — E669 Obesity, unspecified: Secondary | ICD-10-CM | POA: Diagnosis not present

## 2016-12-09 DIAGNOSIS — Z6841 Body Mass Index (BMI) 40.0 and over, adult: Secondary | ICD-10-CM | POA: Diagnosis not present

## 2016-12-09 DIAGNOSIS — IMO0001 Reserved for inherently not codable concepts without codable children: Secondary | ICD-10-CM

## 2016-12-09 NOTE — Progress Notes (Signed)
Office: [phone]  /  Fax: [phone]   HPI:   Chief Complaint: OBESITY Lauren Lambert is here to discuss her progress with her obesity treatment plan. She is on the  follow the Category 3 plan and is following her eating plan approximately 75 % of the time. She states she is swimming for 30 minutes 5 times per week. Lauren Lambert continues to do well with weight loss. She will be going on a road trip and will not be back until February 2019. Would like travel/emotional eating strategies. Her weight is 256 lb (116.1 kg) today and has had a weight loss of 4 pounds over a period of 2 weeks since her last visit. She has lost 14 lbs since starting treatment with [place].  Hypertension Lauren Lambert is a 61 y.o. female with hypertension.  Lauren Lambert denies chest pain or shortness of breath on exertion. She is working weight loss to help control her blood Lambert with the goal of decreasing her risk of heart attack and stroke. Lauren Lambert is currently controlled.    ALLERGIES: Allergies  Allergen Reactions  . Adhesive [Tape]   . Aspirin     Nose bleeds  . Ciprofloxacin Other (See Comments)    Brown urine, throat itches  . Codeine Nausea Only    Unknown  . Hydrocodone-Acetaminophen Other (See Comments)    Unknown  . Latex     MEDICATIONS: Current Outpatient Prescriptions on File Prior to Visit  Medication Sig Dispense Refill  . albuterol (PROVENTIL) (2.5 MG/3ML) 0.083% nebulizer solution Take 2.5 mg by nebulization every 6 (six) hours as needed for wheezing or shortness of breath.    . doxycycline (VIBRAMYCIN) 100 MG capsule Take 100 mg by mouth 2 (two) times daily.    Marland Kitchen losartan-hydrochlorothiazide (HYZAAR) 100-12.5 MG tablet Take 1 tablet by mouth daily. 90 tablet 2  . Multiple Vitamin (MULTIVITAMIN) tablet Take 1 tablet by mouth daily.    . Probiotic Product (ALIGN) 4 MG CAPS Take by mouth.     No current facility-administered medications on file prior to  visit.     PAST MEDICAL HISTORY: Past Medical History:  Diagnosis Date  . Back pain   . GERD (gastroesophageal reflux disease)   . Heart valve problem   . Hydatid cyst    Right  . Hypertension   . Hypothyroidism   . Joint pain   . Obesity   . Rheumatoid arthritis ([place])   . Sleep apnea   . Swallowing difficulty   . Swelling     PAST SURGICAL HISTORY: Past Surgical History:  Procedure Laterality Date  . ABDOMINAL HYSTERECTOMY    . DILATION AND CURETTAGE OF UTERUS    . EYE SURGERY    . FOOT SURGERY    . HERNIA REPAIR    . HYSTEROSCOPY    . MOUTH SURGERY    . PELVIC LAPAROSCOPY      SOCIAL HISTORY: Social History  Substance Use Topics  . Smoking status: Never Smoker  . Smokeless tobacco: Never Used  . Alcohol use 3.0 oz/week    6 Standard drinks or equivalent per week    FAMILY HISTORY: Family History  Problem Relation Age of Onset  . Hypertension Mother   . Heart disease Mother   . Obesity Mother   . Hypertension Father   . Heart disease Father   . Obesity Father   . Breast cancer Maternal Aunt        Age 25's  . Colon cancer  Maternal Grandmother   . Uterine cancer Maternal Aunt     ROS: Review of Systems  Constitutional: Positive for weight loss.  Respiratory: Negative for shortness of breath.   Cardiovascular: Negative for chest pain.    PHYSICAL EXAM: Blood Lambert 128/81, pulse 87, temperature 97.9 F (36.6 C), temperature source Oral, height 5\' 3"  (1.6 m), weight 256 lb (116.1 kg), SpO2 97 %. Body mass index is 45.35 kg/m. Physical Exam  Constitutional: She is oriented to person, place, and time. She appears well-developed and well-nourished.  Cardiovascular: Normal rate.   Pulmonary/Chest: Effort normal.  Musculoskeletal: Normal range of motion.  Neurological: She is alert and oriented to person, place, and time.  Skin: Skin is warm and dry.  Psychiatric: She has a normal mood and affect.    RECENT LABS AND TESTS: BMET      Component Value Date/Time   NA 140 10/19/2016 0836   K 4.4 10/19/2016 0836   CL 100 10/19/2016 0836   CO2 24 10/19/2016 0836   GLUCOSE 113 (H) 10/19/2016 0836   GLUCOSE 95 09/18/2016 0818   BUN 23 10/19/2016 0836   CREATININE 0.82 10/19/2016 0836   CALCIUM 9.2 10/19/2016 0836   GFRNONAA 79 10/19/2016 0836   GFRAA 91 10/19/2016 0836   Lab Results  Component Value Date   HGBA1C 5.8 (H) 10/19/2016   HGBA1C 6.1 09/18/2016   Lab Results  Component Value Date   INSULIN 24.1 10/19/2016   CBC    Component Value Date/Time   WBC 8.4 10/19/2016 0836   WBC 7.8 09/18/2016 0818   RBC 5.02 10/19/2016 0836   RBC 5.14 (H) 09/18/2016 0818   HGB 14.2 10/19/2016 0836   HCT 43.1 10/19/2016 0836   PLT 289.0 09/18/2016 0818   MCV 86 10/19/2016 0836   MCH 28.3 10/19/2016 0836   MCHC 32.9 10/19/2016 0836   MCHC 33.6 09/18/2016 0818   RDW 15.6 (H) 10/19/2016 0836   LYMPHSABS 2.2 10/19/2016 0836   EOSABS 0.2 10/19/2016 0836   BASOSABS 0.1 10/19/2016 0836   Iron/TIBC/Ferritin/ %Sat    Component Value Date/Time   IRON 62 10/19/2016 0836   TIBC 283 10/19/2016 0836   FERRITIN 189 (H) 10/19/2016 0836   IRONPCTSAT 22 10/19/2016 0836   Lipid Panel     Component Value Date/Time   CHOL 226 (H) 10/19/2016 0836   TRIG 114 10/19/2016 0836   HDL 52 10/19/2016 0836   CHOLHDL 3 09/18/2016 0818   VLDL 18.6 09/18/2016 0818   LDLCALC 151 (H) 10/19/2016 0836   Hepatic Function Panel     Component Value Date/Time   PROT 7.0 10/19/2016 0836   ALBUMIN 4.3 10/19/2016 0836   AST 11 10/19/2016 0836   ALT 16 10/19/2016 0836   ALKPHOS 90 10/19/2016 0836   BILITOT 0.5 10/19/2016 0836      Component Value Date/Time   TSH 4.240 10/19/2016 0836   TSH 2.86 09/18/2016 0818    ASSESSMENT AND PLAN: Essential hypertension  Class 3 obesity with serious comorbidity and body mass index (BMI) of 45.0 to 49.9 in adult, unspecified obesity type (Lauren Lambert)  PLAN:  Hypertension We discussed sodium  restriction, working on healthy weight loss, and a regular exercise program as the means to achieve improved blood Lambert control. Lauren Lambert agreed with this plan and agreed to follow up as directed. We will continue to monitor her blood Lambert as well as her progress with the above lifestyle modifications. She will continue her medications as prescribed and will watch  for signs of hypotension as she continues her lifestyle modifications.  Obesity Lauren Lambert is currently in the action stage of change. As such, her goal is to continue with weight loss efforts She has agreed to portion control better and make smarter food choices, such as increase vegetables and decrease simple carbohydrates  and keep a food journal with 1600 calories and 100+g protein  Lauren Lambert has been instructed to work up to a goal of 150 minutes of combined cardio and strengthening exercise per week for weight loss and overall health benefits. We discussed the following Behavioral Modification Stratagies today: : increasing lean protein intake, decreasing simple carbohydrates, emotional eating strategies and keeping healthy foods in the home, and travel eating strategies.    Lauren Lambert will be traveling for the next 6-7 months and will not be able to follow up with our clinic until February 2018.  She was informed of the importance of frequent follow up visits to maximize her success with intensive lifestyle modifications for her multiple health conditions.  We spent > than 50% of the 15 minute visit on the counseling as documented in the note.   Office: [phone]  /  Fax: [phone]  OBESITY BEHAVIORAL INTERVENTION VISIT  Today's visit was # 4 out of 22.  Starting weight: 270 Starting date: 10/15/16 Today's weight : Weight: 256 lb (116.1 kg)  Today's date: 12/09/2016 Total lbs lost to date: 16 (Patients must lose 7 lbs in the first 6 months to continue with counseling)   ASK: We discussed the diagnosis of obesity  with Lauren Lambert today and Lauren Lambert agreed to give [place] permission to discuss obesity behavioral modification therapy today.  ASSESS: Lauren Lambert has the diagnosis of obesity and her BMI today is 45.4 Lauren Lambert is in the action stage of change   ADVISE: Lauren Lambert was educated on the multiple health risks of obesity as well as the benefit of weight loss to improve her health. She was advised of the need for long term treatment and the importance of lifestyle modifications.  AGREE: Multiple dietary modification options and treatment options were discussed and  Lauren Lambert agreed to keep a food journal with 1600 calories and 100+g protein  We discussed the following Behavioral Modification Stratagies today: increasing lean protein intake, decreasing simple carbohydrates  and emotional eating strategies and keeping healthy foods in the home, and travel eating strategies.     I have reviewed the above documentation for accuracy and completeness, and I agree with the above. -Lacy Duverney, PA-C  I have reviewed the above note and agree with the plan. -Dennard Nip, MD

## 2017-04-09 ENCOUNTER — Telehealth: Payer: Self-pay | Admitting: Family Medicine

## 2017-04-09 NOTE — Telephone Encounter (Signed)
Please see CRM note below and advise.  Copied from [place] [phone]. Topic: General - Other >> Apr 09, 2017  4:57 PM Cecelia Byars, NT wrote: Reason for CRM: Patient called and said medicine that is on is not in recall and she can take it per pharamcy

## 2017-04-12 NOTE — Telephone Encounter (Signed)
Please advise. Dr. Leafy Ro is the physician that ordered the last labs. Do you want to order or have Dr. Leafy Ro order?

## 2017-04-12 NOTE — Telephone Encounter (Signed)
Copied from [place]. Topic: Quick Communication - See Telephone Encounter >> Apr 09, 2017  4:49 PM Percell Belt A wrote: CRM for notification. See Telephone encounter for: pt called in and said that she travels and she needs labs done to fu on labs she had a while back.  She will not be back til march.  She would like to know if there is any way possible to have orders fax to out in [place] where she is to get labs done somewhere out there?  She need a basic lipid panel done and a a1c.  Told her I was unconcern how that would work and would have office contact her.  Best [phone]  04/09/17.

## 2017-04-13 NOTE — Telephone Encounter (Signed)
Left detailed message about Dr. Alcario Drought message on patient voicemail. I told her that if she has any questions to give [place] a call back.

## 2017-04-13 NOTE — Telephone Encounter (Signed)
These are not urgent labs and can wait until she is back. She can follow up with Dr. Leafy Ro and/or me in March. If acute issues, should seek care where she is currently living. EW

## 2017-05-14 ENCOUNTER — Other Ambulatory Visit: Payer: Self-pay | Admitting: Family Medicine

## 2017-05-14 DIAGNOSIS — I10 Essential (primary) hypertension: Secondary | ICD-10-CM

## 2017-07-18 DIAGNOSIS — E785 Hyperlipidemia, unspecified: Secondary | ICD-10-CM | POA: Insufficient documentation

## 2017-07-18 DIAGNOSIS — E8881 Metabolic syndrome: Secondary | ICD-10-CM | POA: Insufficient documentation

## 2017-07-18 DIAGNOSIS — E88819 Insulin resistance, unspecified: Secondary | ICD-10-CM | POA: Insufficient documentation

## 2017-07-18 NOTE — Assessment & Plan Note (Addendum)
Working on weight loss through regular exercise and healthier food choices. Consider Metformin.

## 2017-07-18 NOTE — Progress Notes (Signed)
Lauren Lambert is a 62 y.o. female is here for follow up.  History of Present Illness:   HPI: See Assessment and Plan section for Problem Based Charting of issues discussed today.  Health Maintenance Due  Topic Date Due  . Hepatitis C Screening  01/01/1956  . HIV Screening  08/06/1972  . TETANUS/TDAP  08/06/1976  . MAMMOGRAM  08/07/2007  . COLONOSCOPY  08/07/2007  . INFLUENZA VACCINE  12/09/2016   Depression screen PHQ 2/9 10/15/2016  Decreased Interest 3  Down, Depressed, Hopeless 2  PHQ - 2 Score 5  Altered sleeping 2  Tired, decreased energy 3  Change in appetite 3  Feeling bad or failure about yourself  2  Trouble concentrating 2  Moving slowly or fidgety/restless 3  Suicidal thoughts 1  PHQ-9 Score 21   PMHx, SurgHx, SocialHx, FamHx, Medications, and Allergies were reviewed in the Visit Navigator and updated as appropriate.   Patient Active Problem List   Diagnosis Date Noted  . Umbilical hernia without obstruction and without gangrene 07/21/2017  . Dysthymia 07/21/2017  . Insulin resistance, diet controlled 07/18/2017  . HLD (hyperlipidemia), diet controlled 07/18/2017  . GERD (gastroesophageal reflux disease) 10/17/2016  . Hypertension, on Hyzaar   . Hypothyroidism    Social History   Tobacco Use  . Smoking status: Never Smoker  . Smokeless tobacco: Never Used  Substance Use Topics  . Alcohol use: Yes    Alcohol/week: 3.0 oz    Types: 6 Standard drinks or equivalent per week  . Drug use: Not on file   Current Medications and Allergies:   .  albuterol (PROVENTIL) (2.5 MG/3ML) 0.083% nebulizer solution, Take 2.5 mg by nebulization every 6 (six) hours as needed for wheezing or shortness of breath., Disp: , Rfl:  .  losartan-hydrochlorothiazide (HYZAAR) 100-12.5 MG tablet, TAKE 1 TABLET BY MOUTH DAILY, Disp: 90 tablet, Rfl: 0 .  Multiple Vitamin (MULTIVITAMIN) tablet, Take 1 tablet by mouth daily., Disp: , Rfl:  .  Probiotic Product (ALIGN) 4 MG CAPS,  Take by mouth., Disp: , Rfl:   Allergies  Allergen Reactions  . Adhesive [Tape]   . Aspirin Other (See Comments)    Reduces her clotting factor  . Ciprofloxacin Other (See Comments)    Brown urine, throat itches  . Codeine Nausea Only    Take with food only  . Hydrocodone-Acetaminophen Other (See Comments)    Take with food only  . Latex    Review of Systems   Pertinent items are noted in the HPI. Otherwise, ROS is negative.  Vitals:   Vitals:   07/19/17 0951  BP: 140/90  Pulse: 82  Temp: 98.1 F (36.7 C)  SpO2: 98%  Weight: 267 lb (121.1 kg)  Height: 5\' 3"  (1.6 m)     Body mass index is 47.3 kg/m.  Physical Exam:   Physical Exam  Constitutional: She is oriented to person, place, and time. She appears well-developed and well-nourished. No distress.  HENT:  Head: Normocephalic and atraumatic.  Right Ear: External ear normal.  Left Ear: External ear normal.  Nose: Nose normal.  Mouth/Throat: Oropharynx is clear and moist.  Eyes: Conjunctivae and EOM are normal. Pupils are equal, round, and reactive to light.  Neck: Normal range of motion. Neck supple. No thyromegaly present.  Cardiovascular: Normal rate, regular rhythm, normal heart sounds and intact distal pulses.  Pulmonary/Chest: Effort normal and breath sounds normal.  Abdominal: Soft. Bowel sounds are normal.  Musculoskeletal: Normal range of motion.  Lymphadenopathy:    She has no cervical adenopathy.  Neurological: She is alert and oriented to person, place, and time.  Skin: Skin is warm and dry. Capillary refill takes less than 2 seconds.  Psychiatric: She has a normal mood and affect. Her behavior is normal.  Nursing note and vitals reviewed.   Results for orders placed or performed in visit on 07/19/17  Comprehensive metabolic panel  Result Value Ref Range   Sodium 139 135 - 145 mEq/L   Potassium 4.6 3.5 - 5.1 mEq/L   Chloride 103 96 - 112 mEq/L   CO2 29 19 - 32 mEq/L   Glucose, Bld 105 (H) 70 -  99 mg/dL   BUN 17 6 - 23 mg/dL   Creatinine, Ser 0.84 0.40 - 1.20 mg/dL   Total Bilirubin 0.7 0.2 - 1.2 mg/dL   Alkaline Phosphatase 65 39 - 117 U/L   AST 13 0 - 37 U/L   ALT 11 0 - 35 U/L   Total Protein 7.1 6.0 - 8.3 g/dL   Albumin 4.3 3.5 - 5.2 g/dL   Calcium 9.7 8.4 - 10.5 mg/dL   GFR 73.52 >60.00 mL/min  TSH  Result Value Ref Range   TSH 28.27 (H) 0.35 - 4.50 uIU/mL  T4, free  Result Value Ref Range   Free T4 0.58 (L) 0.60 - 1.60 ng/dL  Hemoglobin A1c  Result Value Ref Range   Hgb A1c MFr Bld 5.7 4.6 - 6.5 %  Lipid panel  Result Value Ref Range   Cholesterol 200 0 - 200 mg/dL   Triglycerides 67.0 0.0 - 149.0 mg/dL   HDL 63.70 >39.00 mg/dL   VLDL 13.4 0.0 - 40.0 mg/dL   LDL Cholesterol 123 (H) 0 - 99 mg/dL   Total CHOL/HDL Ratio 3    NonHDL 136.34    Assessment and Plan:   Patient Active Problem List   Diagnosis Date Noted  . Umbilical hernia without obstruction and without gangrene 07/21/2017  . Dysthymia 07/21/2017  . Insulin resistance, diet controlled 07/18/2017  . HLD (hyperlipidemia), diet controlled 07/18/2017  . GERD (gastroesophageal reflux disease) 10/17/2016  . Hypertension, on Hyzaar   . Hypothyroidism    1. Umbilical hernia without obstruction and without gangrene   2. Insulin resistance   3. Hypothyroidism, unspecified type   4. Essential hypertension   5. Mixed hyperlipidemia   6. Dysthymia    Hypertension, on Hyzaar Review: NOT taking medications as instructed, no TIAs, no chest pain on exertion, no dyspnea on exertion, no swelling of ankles.  Smoker: No.   Use of agents associated with hypertension: none.   Cardiovascular risk factors: dyslipidemia, hypertension and obesity (BMI >= 30 kg/m2).  History of target organ damage: none.  Medication: change to Cozaar, 1/2 dose. Dietary sodium restriction. Regular aerobic exercise. Follow up: 6 months and as needed.  Insulin resistance, diet controlled Working on weight loss through regular  exercise and healthier food choices. Consider Metformin.  HLD (hyperlipidemia), diet controlled 1. Dietary recommendations: Heart Healthy Diet, to include decreased simple carbohydrates. 2. Exercise recommendations: 150 minutes of moderate exercise, or 75 minutes of vigorous exercise per week. 3. Other treatment: weight reduction. 4. Lipid-lowering medications: None at present. Will consider if LDL > 190 on recheck.  Hypothyroidism Lab Results  Component Value Date   TSH 28.27 (H) 07/19/2017   Patient has not been on medication for years. Steadily gaining weight. Will go ahead and call in low dose to start. Recheck when back in town -  ideally 6 weeks.  Umbilical hernia without obstruction and without gangrene History of abdominal surgery and previous repair. Was lifting an item and felt a "tear" in her mid abdomen. Feels a bulge. Would like to see a general surgeon, but first needs a CT.   Dysthymia New. Crying spells. Travels a lot. Feels that she has a great life, but gets lonely. No SI. Will write Rx for SSRI. Okay to trial when she is ready. Discussed counseling apps.   Orders Placed This Encounter  Procedures  . CT Abdomen Pelvis W Contrast  . Comprehensive metabolic panel  . TSH  . T4, free  . Hemoglobin A1c  . Lipid panel   Meds ordered this encounter  Medications  . FLUoxetine (PROZAC) 20 MG tablet    Sig: Take 1 tablet (20 mg total) by mouth daily.    Dispense:  30 tablet    Refill:  6  . losartan (COZAAR) 50 MG tablet    Sig: Take 1 tablet (50 mg total) by mouth daily.    Dispense:  90 tablet    Refill:  1  . levothyroxine (SYNTHROID, LEVOTHROID) 50 MCG tablet    Sig: Take 1 tablet (50 mcg total) by mouth daily.    Dispense:  90 tablet    Refill:  3   . Reviewed expectations re: course of current medical issues. . Discussed self-management of symptoms. . Outlined signs and symptoms indicating need for more acute intervention. . Patient verbalized  understanding and all questions were answered. Marland Kitchen Health Maintenance issues including appropriate healthy diet, exercise, and smoking avoidance were discussed with patient. . See orders for this visit as documented in the electronic medical record. . Patient received an After Visit Summary.  CMA served as Education administrator during this visit. History, Physical, and Plan performed by medical provider. The above documentation has been reviewed and is accurate and complete. Briscoe Deutscher, D.O.  Briscoe Deutscher, DO [place], [place] 07/21/2017  Records requested if needed. Time spent with the patient: 45 minutes, of which >50% was spent in obtaining information about her symptoms, reviewing her previous labs, evaluations, and treatments, counseling her about her condition (please see the discussed topics above), and developing a plan to further investigate it; she had a number of questions which I addressed.

## 2017-07-18 NOTE — Assessment & Plan Note (Addendum)
1. Dietary recommendations: Heart Healthy Diet, to include decreased simple carbohydrates. 2. Exercise recommendations: 150 minutes of moderate exercise, or 75 minutes of vigorous exercise per week. 3. Other treatment: weight reduction. 4. Lipid-lowering medications: None at present. Will consider if LDL > 190 on recheck.

## 2017-07-18 NOTE — Assessment & Plan Note (Addendum)
Review: NOT taking medications as instructed, no TIAs, no chest pain on exertion, no dyspnea on exertion, no swelling of ankles.  Smoker: No.   Use of agents associated with hypertension: none.   Cardiovascular risk factors: dyslipidemia, hypertension and obesity (BMI >= 30 kg/m2).  History of target organ damage: none.  Medication: change to Cozaar, 1/2 dose. Dietary sodium restriction. Regular aerobic exercise. Follow up: 6 months and as needed.

## 2017-07-19 ENCOUNTER — Ambulatory Visit (INDEPENDENT_AMBULATORY_CARE_PROVIDER_SITE_OTHER): Payer: BLUE CROSS/BLUE SHIELD | Admitting: Family Medicine

## 2017-07-19 ENCOUNTER — Encounter: Payer: Self-pay | Admitting: Family Medicine

## 2017-07-19 VITALS — BP 140/90 | HR 82 | Temp 98.1°F | Ht 63.0 in | Wt 267.0 lb

## 2017-07-19 DIAGNOSIS — E782 Mixed hyperlipidemia: Secondary | ICD-10-CM | POA: Diagnosis not present

## 2017-07-19 DIAGNOSIS — E8881 Metabolic syndrome: Secondary | ICD-10-CM | POA: Diagnosis not present

## 2017-07-19 DIAGNOSIS — E88819 Insulin resistance, unspecified: Secondary | ICD-10-CM

## 2017-07-19 DIAGNOSIS — I10 Essential (primary) hypertension: Secondary | ICD-10-CM | POA: Diagnosis not present

## 2017-07-19 DIAGNOSIS — F341 Dysthymic disorder: Secondary | ICD-10-CM

## 2017-07-19 DIAGNOSIS — Z1231 Encounter for screening mammogram for malignant neoplasm of breast: Secondary | ICD-10-CM | POA: Diagnosis not present

## 2017-07-19 DIAGNOSIS — E039 Hypothyroidism, unspecified: Secondary | ICD-10-CM

## 2017-07-19 DIAGNOSIS — K429 Umbilical hernia without obstruction or gangrene: Secondary | ICD-10-CM

## 2017-07-19 LAB — COMPREHENSIVE METABOLIC PANEL
ALT: 11 U/L (ref 0–35)
AST: 13 U/L (ref 0–37)
Albumin: 4.3 g/dL (ref 3.5–5.2)
Alkaline Phosphatase: 65 U/L (ref 39–117)
BUN: 17 mg/dL (ref 6–23)
CO2: 29 mEq/L (ref 19–32)
Calcium: 9.7 mg/dL (ref 8.4–10.5)
Chloride: 103 mEq/L (ref 96–112)
Creatinine, Ser: 0.84 mg/dL (ref 0.40–1.20)
GFR: 73.52 mL/min (ref >60.00)
Glucose, Bld: 105 mg/dL — ABNORMAL HIGH (ref 70–99)
Potassium: 4.6 mEq/L (ref 3.5–5.1)
Sodium: 139 mEq/L (ref 135–145)
Total Bilirubin: 0.7 mg/dL (ref 0.2–1.2)
Total Protein: 7.1 g/dL (ref 6.0–8.3)

## 2017-07-19 LAB — LIPID PANEL
Cholesterol: 200 mg/dL (ref 0–200)
HDL: 63.7 mg/dL (ref >39.00)
LDL Cholesterol: 123 mg/dL — ABNORMAL HIGH (ref 0–99)
NonHDL: 136.34
Total CHOL/HDL Ratio: 3
Triglycerides: 67 mg/dL (ref 0.0–149.0)
VLDL: 13.4 mg/dL (ref 0.0–40.0)

## 2017-07-19 LAB — HEMOGLOBIN A1C: Hgb A1c MFr Bld: 5.7 % (ref 4.6–6.5)

## 2017-07-19 MED ORDER — LOSARTAN POTASSIUM 50 MG PO TABS: 50.0000 mg | ORAL_TABLET | Freq: Every day | ORAL | 1 refills | Status: DC

## 2017-07-19 MED ORDER — FLUOXETINE HCL 20 MG PO TABS: 20.0000 mg | ORAL_TABLET | Freq: Every day | ORAL | 6 refills | Status: DC

## 2017-07-19 NOTE — Patient Instructions (Signed)
Stop the Hyzaar.

## 2017-07-20 ENCOUNTER — Telehealth: Payer: Self-pay | Admitting: Family Medicine

## 2017-07-20 DIAGNOSIS — R194 Change in bowel habit: Secondary | ICD-10-CM | POA: Diagnosis not present

## 2017-07-20 DIAGNOSIS — Z1211 Encounter for screening for malignant neoplasm of colon: Secondary | ICD-10-CM | POA: Diagnosis not present

## 2017-07-20 DIAGNOSIS — Z86010 Personal history of colonic polyps: Secondary | ICD-10-CM | POA: Diagnosis not present

## 2017-07-20 DIAGNOSIS — K625 Hemorrhage of anus and rectum: Secondary | ICD-10-CM | POA: Diagnosis not present

## 2017-07-20 LAB — TSH: TSH: 28.27 u[IU]/mL — ABNORMAL HIGH (ref 0.35–4.50)

## 2017-07-20 LAB — T4, FREE: Free T4: 0.58 ng/dL — ABNORMAL LOW (ref 0.60–1.60)

## 2017-07-20 NOTE — Telephone Encounter (Signed)
Copied from [place] [id number]. Topic: Quick Communication - See Telephone Encounter >> Jul 20, 2017 11:52 AM Ahmed Prima L wrote: CRM for notification. See Telephone encounter for:   07/20/17.  Patient said she has concerns about the "prozac". She wants to know is this prozac going to affect her sex drive? She has not started taking the pills yet. She said she is going on a romantic getaway march 26th. Please call patient back [phone]

## 2017-07-20 NOTE — Telephone Encounter (Signed)
Copied from [place]. Topic: Quick Communication - See Telephone Encounter >> Jul 20, 2017  9:33 AM Lolita Rieger, RMA wrote: CRM for notification. See Telephone encounter for:   07/20/17. Cindy from Dr. [facility] would like a copy of results from most recent labs faxed to [phone]

## 2017-07-20 NOTE — Telephone Encounter (Signed)
See note

## 2017-07-20 NOTE — Telephone Encounter (Signed)
Labs are not back yet. When they come back will send.

## 2017-07-20 NOTE — Telephone Encounter (Signed)
Labs have been faxed to GI doctor.

## 2017-07-20 NOTE — Telephone Encounter (Signed)
Please advise 

## 2017-07-21 ENCOUNTER — Telehealth: Payer: Self-pay | Admitting: Family Medicine

## 2017-07-21 DIAGNOSIS — F341 Dysthymic disorder: Secondary | ICD-10-CM | POA: Insufficient documentation

## 2017-07-21 DIAGNOSIS — K429 Umbilical hernia without obstruction or gangrene: Secondary | ICD-10-CM | POA: Insufficient documentation

## 2017-07-21 MED ORDER — LEVOTHYROXINE SODIUM 50 MCG PO TABS: 50.0000 ug | ORAL_TABLET | Freq: Every day | ORAL | 3 refills | Status: DC

## 2017-07-21 NOTE — Telephone Encounter (Signed)
Copied from [place] [phone]. Topic: Quick Communication - See Telephone Encounter >> Jul 21, 2017 10:50 AM Ether Griffins B wrote: CRM for notification. See Telephone encounter for: pt wanting to know about her lab results  07/21/17.

## 2017-07-21 NOTE — Assessment & Plan Note (Signed)
New. Crying spells. Travels a lot. Feels that she has a great life, but gets lonely. No SI. Will write Rx for SSRI. Okay to trial when she is ready. Discussed counseling apps.

## 2017-07-21 NOTE — Assessment & Plan Note (Signed)
Lab Results  Component Value Date   TSH 28.27 (H) 07/19/2017   Patient has not been on medication for years. Steadily gaining weight. Will go ahead and call in low dose to start. Recheck when back in town - ideally 6 weeks.

## 2017-07-21 NOTE — Telephone Encounter (Signed)
See result note.  

## 2017-07-21 NOTE — Telephone Encounter (Signed)
Please advise on results

## 2017-07-21 NOTE — Assessment & Plan Note (Signed)
History of abdominal surgery and previous repair. Was lifting an item and felt a "tear" in her mid abdomen. Feels a bulge. Would like to see a general surgeon, but first needs a CT.

## 2017-07-21 NOTE — Telephone Encounter (Signed)
See note

## 2017-07-22 ENCOUNTER — Other Ambulatory Visit: Payer: Self-pay

## 2017-07-22 ENCOUNTER — Encounter: Payer: Self-pay | Admitting: Family Medicine

## 2017-07-22 ENCOUNTER — Ambulatory Visit (INDEPENDENT_AMBULATORY_CARE_PROVIDER_SITE_OTHER)
Admission: RE | Admit: 2017-07-22 | Discharge: 2017-07-22 | Disposition: A | Discharge status: Home / Self Care | Payer: BLUE CROSS/BLUE SHIELD | Source: Ambulatory Visit | Attending: Family Medicine | Admitting: Family Medicine

## 2017-07-22 DIAGNOSIS — K429 Umbilical hernia without obstruction or gangrene: Secondary | ICD-10-CM | POA: Diagnosis not present

## 2017-07-22 DIAGNOSIS — K449 Diaphragmatic hernia without obstruction or gangrene: Secondary | ICD-10-CM | POA: Insufficient documentation

## 2017-07-22 DIAGNOSIS — M5136 Other intervertebral disc degeneration, lumbar region: Secondary | ICD-10-CM | POA: Insufficient documentation

## 2017-07-22 DIAGNOSIS — K8020 Calculus of gallbladder without cholecystitis without obstruction: Secondary | ICD-10-CM | POA: Insufficient documentation

## 2017-07-22 DIAGNOSIS — K5730 Diverticulosis of large intestine without perforation or abscess without bleeding: Secondary | ICD-10-CM | POA: Insufficient documentation

## 2017-07-22 DIAGNOSIS — K439 Ventral hernia without obstruction or gangrene: Secondary | ICD-10-CM | POA: Diagnosis not present

## 2017-07-22 DIAGNOSIS — E039 Hypothyroidism, unspecified: Secondary | ICD-10-CM

## 2017-07-22 DIAGNOSIS — K760 Fatty (change of) liver, not elsewhere classified: Secondary | ICD-10-CM | POA: Insufficient documentation

## 2017-07-22 MED ORDER — IOPAMIDOL (ISOVUE-300) INJECTION 61%
100.0000 mL | Freq: Once | INTRAVENOUS | Status: AC | PRN
  Administered 2017-07-22: 100 mL via INTRAVENOUS

## 2017-07-23 NOTE — Telephone Encounter (Signed)
Results was given. Please see result note.

## 2017-07-30 ENCOUNTER — Encounter: Payer: Self-pay | Admitting: Family Medicine

## 2017-07-30 DIAGNOSIS — D123 Benign neoplasm of transverse colon: Secondary | ICD-10-CM | POA: Diagnosis not present

## 2017-07-30 DIAGNOSIS — D128 Benign neoplasm of rectum: Secondary | ICD-10-CM | POA: Diagnosis not present

## 2017-07-30 DIAGNOSIS — Z1211 Encounter for screening for malignant neoplasm of colon: Secondary | ICD-10-CM | POA: Diagnosis not present

## 2017-07-30 DIAGNOSIS — K635 Polyp of colon: Secondary | ICD-10-CM | POA: Diagnosis not present

## 2017-07-30 DIAGNOSIS — D369 Benign neoplasm, unspecified site: Secondary | ICD-10-CM | POA: Insufficient documentation

## 2017-07-30 DIAGNOSIS — D127 Benign neoplasm of rectosigmoid junction: Secondary | ICD-10-CM | POA: Diagnosis not present

## 2017-07-30 DIAGNOSIS — D120 Benign neoplasm of cecum: Secondary | ICD-10-CM | POA: Diagnosis not present

## 2017-07-30 DIAGNOSIS — K621 Rectal polyp: Secondary | ICD-10-CM | POA: Diagnosis not present

## 2017-07-30 LAB — HM COLONOSCOPY

## 2017-08-19 DIAGNOSIS — D369 Benign neoplasm, unspecified site: Secondary | ICD-10-CM

## 2017-08-19 HISTORY — DX: Benign neoplasm, unspecified site: D36.9

## 2017-08-23 ENCOUNTER — Other Ambulatory Visit (INDEPENDENT_AMBULATORY_CARE_PROVIDER_SITE_OTHER): Payer: BLUE CROSS/BLUE SHIELD

## 2017-08-23 DIAGNOSIS — E039 Hypothyroidism, unspecified: Secondary | ICD-10-CM | POA: Diagnosis not present

## 2017-08-23 LAB — TSH: TSH: 12.19 u[IU]/mL — ABNORMAL HIGH (ref 0.35–4.50)

## 2017-08-23 MED ORDER — LEVOTHYROXINE SODIUM 75 MCG PO TABS: 75.0000 ug | ORAL_TABLET | Freq: Every day | ORAL | 3 refills | Status: DC

## 2017-08-23 NOTE — Addendum Note (Signed)
Addended by: Briscoe Deutscher R on: 08/23/2017 01:25 PM   Modules accepted: Orders

## 2017-09-28 ENCOUNTER — Encounter: Payer: Self-pay | Admitting: Family Medicine

## 2017-09-28 ENCOUNTER — Ambulatory Visit (INDEPENDENT_AMBULATORY_CARE_PROVIDER_SITE_OTHER): Payer: BLUE CROSS/BLUE SHIELD

## 2017-09-28 ENCOUNTER — Ambulatory Visit (INDEPENDENT_AMBULATORY_CARE_PROVIDER_SITE_OTHER): Payer: BLUE CROSS/BLUE SHIELD | Admitting: Family Medicine

## 2017-09-28 VITALS — BP 130/82 | Temp 98.2°F | Wt 272.6 lb

## 2017-09-28 DIAGNOSIS — E039 Hypothyroidism, unspecified: Secondary | ICD-10-CM | POA: Diagnosis not present

## 2017-09-28 DIAGNOSIS — E038 Other specified hypothyroidism: Secondary | ICD-10-CM | POA: Diagnosis not present

## 2017-09-28 DIAGNOSIS — M25571 Pain in right ankle and joints of right foot: Secondary | ICD-10-CM

## 2017-09-28 DIAGNOSIS — I10 Essential (primary) hypertension: Secondary | ICD-10-CM

## 2017-09-28 DIAGNOSIS — F341 Dysthymic disorder: Secondary | ICD-10-CM | POA: Diagnosis not present

## 2017-09-28 DIAGNOSIS — Z0184 Encounter for antibody response examination: Secondary | ICD-10-CM

## 2017-09-28 DIAGNOSIS — Z23 Encounter for immunization: Secondary | ICD-10-CM

## 2017-09-28 DIAGNOSIS — E063 Autoimmune thyroiditis: Secondary | ICD-10-CM

## 2017-09-28 LAB — T4, FREE: Free T4: 0.72 ng/dL (ref 0.60–1.60)

## 2017-09-28 LAB — TSH: TSH: 4.71 u[IU]/mL — ABNORMAL HIGH (ref 0.35–4.50)

## 2017-09-28 NOTE — Progress Notes (Signed)
Lauren Lambert is a 62 y.o. female is here for follow up.  History of Present Illness:   HPI:   1. Dysthymia. Improved. Stopped medication due to vaginal dryness and decreased libido. Doing well with friend support. Planning to get a therapist while in [place].   2. Essential hypertension.  Review: taking medications as instructed, no medication side effects noted, no TIAs, no chest pain on exertion, no dyspnea on exertion, no swelling of ankles.  Smoker: No.  Wt Readings from Last 3 Encounters:  09/28/17 272 lb 9.6 oz (123.7 kg)  07/19/17 267 lb (121.1 kg)  12/09/16 256 lb (116.1 kg)   BP Readings from Last 3 Encounters:  09/28/17 130/82  07/19/17 140/90  12/09/16 128/81   Lab Results  Component Value Date   CREATININE 0.84 07/19/2017     3. Hypothyroidism due to Hashimoto's thyroiditis.  Patient stopped her synthroid. Patient believes that her supplements is what caused her thyroid to go out of range. Caused anxiety, tachycardia.   4. Acute right ankle pain. Fell and twisted ankle. Swelling improved. Was able to walk. Just not improving to baseline.     Health Maintenance Due  Topic Date Due  . MAMMOGRAM  08/07/2007   Depression screen PHQ 2/9 09/28/2017 10/15/2016  Decreased Interest 1 3  Down, Depressed, Hopeless 1 2  PHQ - 2 Score 2 5  Altered sleeping 2 2  Tired, decreased energy 1 3  Change in appetite 1 3  Feeling bad or failure about yourself  1 2  Trouble concentrating 1 2  Moving slowly or fidgety/restless 0 3  Suicidal thoughts 0 1  PHQ-9 Score 8 21  Difficult doing work/chores Somewhat difficult -   PMHx, SurgHx, SocialHx, FamHx, Medications, and Allergies were reviewed in the Visit Navigator and updated as appropriate.   Patient Active Problem List   Diagnosis Date Noted  . Acute right ankle pain 10/03/2017  . Tubular adenoma 07/30/2017  . NAFL (nonalcoholic fatty liver), see CT 07/22/17 07/22/2017  . Recurrent umbilical hernia, small,  containing fat, see CT 07/22/17 07/22/2017  . Diverticula of colon, rectosigmoid and descending colon 07/22/2017  . Hiatal hernia, small 07/22/2017  . DDD (degenerative disc disease), lumbar, L5-S1 07/22/2017  . Gallstone, single, 1.7 cm, see CT 07/23/15 07/22/2017  . Umbilical hernia without obstruction and without gangrene 07/21/2017  . Dysthymia 07/21/2017  . Insulin resistance, diet controlled 07/18/2017  . HLD (hyperlipidemia), diet controlled 07/18/2017  . GERD (gastroesophageal reflux disease) 10/17/2016  . Hypertension, on Hyzaar   . Hypothyroidism    Social History   Tobacco Use  . Smoking status: Never Smoker  . Smokeless tobacco: Never Used  Substance Use Topics  . Alcohol use: Yes    Alcohol/week: 3.0 oz    Types: 6 Standard drinks or equivalent per week  . Drug use: Not on file   Current Medications and Allergies:   Current Outpatient Medications:  .  losartan (COZAAR) 50 MG tablet, Take 1 tablet (50 mg total) by mouth daily., Disp: 90 tablet, Rfl: 1 .  Probiotic Product (ALIGN) 4 MG CAPS, Take by mouth., Disp: , Rfl:    Allergies  Allergen Reactions  . Adhesive [Tape]   . Aspirin Other (See Comments)    Reduces her clotting factor  . Ciprofloxacin Other (See Comments)    Brown urine, throat itches  . Codeine Nausea Only    Take with food only  . Hydrocodone-Acetaminophen Other (See Comments)    Take with food  only  . Latex    Review of Systems   Pertinent items are noted in the HPI. Otherwise, ROS is negative.  Vitals:   Vitals:   09/28/17 0859  BP: 130/82  Temp: 98.2 F (36.8 C)  TempSrc: Oral  Weight: 272 lb 9.6 oz (123.7 kg)     Body mass index is 48.29 kg/m.  Physical Exam:   Physical Exam  Constitutional: She is oriented to person, place, and time. She appears well-developed and well-nourished. No distress.  HENT:  Head: Normocephalic and atraumatic.  Right Ear: External ear normal.  Left Ear: External ear normal.  Nose: Nose  normal.  Mouth/Throat: Oropharynx is clear and moist.  Eyes: Pupils are equal, round, and reactive to light. Conjunctivae and EOM are normal.  Neck: Normal range of motion. Neck supple. No thyromegaly present.  Cardiovascular: Normal rate, regular rhythm, normal heart sounds and intact distal pulses.  Pulmonary/Chest: Effort normal and breath sounds normal.  Abdominal: Soft. Bowel sounds are normal.  Musculoskeletal: Normal range of motion.       Right ankle: She exhibits normal range of motion, no swelling, no ecchymosis, no deformity and normal pulse. Tenderness. Medial malleolus tenderness found.  Lymphadenopathy:    She has no cervical adenopathy.  Neurological: She is alert and oriented to person, place, and time.  Skin: Skin is warm and dry. Capillary refill takes less than 2 seconds.  Psychiatric: She has a normal mood and affect. Her behavior is normal.  Nursing note and vitals reviewed.   Assessment and Plan:   Samreet was seen today for follow-up.  Diagnoses and all orders for this visit:  Dysthymia Comments: Improved. Not on medication any longer.  Essential hypertension Comments: Continue current treatment.  Hypothyroidism due to Hashimoto's thyroiditis Comments: Will recheck labs today. Orders: -     T4, free -     TSH -     Thyroid peroxidase antibody  Acute right ankle pain Comments: Sprain. Xray without fracture. Recommended figure eight brace. Red flags reviewed. Orders: -     DG Ankle Complete Right; Future  Immunity status testing -     Measles/Mumps/Rubella Immunity -     HIV antibody -     Hepatitis C antibody  Need for prophylactic vaccination against diphtheria-tetanus-pertussis (DTP) -     Tdap vaccine greater than or equal to 7yo IM    . Reviewed expectations re: course of current medical issues. . Discussed self-management of symptoms. . Outlined signs and symptoms indicating need for more acute intervention. . Patient verbalized  understanding and all questions were answered. Marland Kitchen Health Maintenance issues including appropriate healthy diet, exercise, and smoking avoidance were discussed with patient. . See orders for this visit as documented in the electronic medical record. . Patient received an After Visit Summary.  Briscoe Deutscher, DO [place], [facility] 10/03/2017  No future appointments.

## 2017-09-28 NOTE — Progress Notes (Deleted)
Lauren Lambert is a 62 y.o. female is here for follow up.  History of Present Illness:   Lauren Lambert CMA acting as scribe for Dr. Juleen Lambert.  HPI: Patient comes in today for her 3 month follow up.   Thyroid: Patient has stopped her synthroid. Patient believes that her supplements is what caused her thyroid to go out of range. We will get thyroid labs today to check her ranges.   Anxiety: Patient stated that she has anger issues. She is going to [place] and would like to see a Social worker. She did try the Prozac and did not like how the medication made her feel.   Health Maintenance Due  Topic Date Due  . Hepatitis C Screening  06-10-1955  . HIV Screening  08/06/1972  . TETANUS/TDAP  08/06/1976  . MAMMOGRAM  08/07/2007   Depression screen PHQ 2/9 10/15/2016  Decreased Interest 3  Down, Depressed, Hopeless 2  PHQ - 2 Score 5  Altered sleeping 2  Tired, decreased energy 3  Change in appetite 3  Feeling bad or failure about yourself  2  Trouble concentrating 2  Moving slowly or fidgety/restless 3  Suicidal thoughts 1  PHQ-9 Score 21   PMHx, SurgHx, SocialHx, FamHx, Medications, and Allergies were reviewed in the Visit Navigator and updated as appropriate.   Patient Active Problem List   Diagnosis Date Noted  . Tubular adenoma 07/30/2017  . NAFL (nonalcoholic fatty liver), see CT 07/22/17 07/22/2017  . Recurrent umbilical hernia, small, containing fat, see CT 07/22/17 07/22/2017  . Diverticula of colon, rectosigmoid and descending colon 07/22/2017  . Hiatal hernia, small 07/22/2017  . DDD (degenerative disc disease), lumbar, L5-S1 07/22/2017  . Gallstone, single, 1.7 cm, see CT 07/23/15 07/22/2017  . Umbilical hernia without obstruction and without gangrene 07/21/2017  . Dysthymia 07/21/2017  . Insulin resistance, diet controlled 07/18/2017  . HLD (hyperlipidemia), diet controlled 07/18/2017  . GERD (gastroesophageal reflux disease) 10/17/2016  . Hypertension, on Hyzaar     . Hypothyroidism    Social History   Tobacco Use  . Smoking status: Never Smoker  . Smokeless tobacco: Never Used  Substance Use Topics  . Alcohol use: Yes    Alcohol/week: 3.0 oz    Types: 6 Standard drinks or equivalent per week  . Drug use: Not on file   Current Medications and Allergies:   Current Outpatient Medications:  .  losartan (COZAAR) 50 MG tablet, Take 1 tablet (50 mg total) by mouth daily., Disp: 90 tablet, Rfl: 1 .  Probiotic Product (ALIGN) 4 MG CAPS, Take by mouth., Disp: , Rfl:   Allergies  Allergen Reactions  . Adhesive [Tape]   . Aspirin Other (See Comments)    Reduces her clotting factor  . Ciprofloxacin Other (See Comments)    Brown urine, throat itches  . Codeine Nausea Only    Take with food only  . Hydrocodone-Acetaminophen Other (See Comments)    Take with food only  . Latex    Review of Systems   Pertinent items are noted in the HPI. Otherwise, ROS is negative.  Vitals:   Vitals:   09/28/17 0859  BP: 130/82  Temp: 98.2 F (36.8 C)  TempSrc: Oral  Weight: 272 lb 9.6 oz (123.7 kg)     Body mass index is 48.29 kg/m.  Physical Exam:   Physical Exam Results for orders placed or performed in visit on 08/23/17  TSH  Result Value Ref Range   TSH 12.19 (H) 0.35 -  4.50 uIU/mL    Assessment and Plan:   Lauren Lambert was seen today for follow-up.  Diagnoses and all orders for this visit:  Dysthymia  Essential hypertension  Acquired hypothyroidism    . Reviewed expectations re: course of current medical issues. . Discussed self-management of symptoms. . Outlined signs and symptoms indicating need for more acute intervention. . Patient verbalized understanding and all questions were answered. Marland Kitchen Health Maintenance issues including appropriate healthy diet, exercise, and smoking avoidance were discussed with patient. . See orders for this visit as documented in the electronic medical record. . Patient received an After Visit  Summary.  CMA served as Education administrator during this visit. History, Physical, and Plan performed by medical provider. The above documentation has been reviewed and is accurate and complete. Lauren Lambert, [place], [place], [facility] 09/28/2017  ***Did you take ownership of the note?

## 2017-09-29 LAB — MEASLES/MUMPS/RUBELLA IMMUNITY
Mumps IgG: 90.3 AU/mL
Rubella: 7.25 index
Rubeola IgG: >300 AU/mL

## 2017-09-29 LAB — HEPATITIS C ANTIBODY
Hepatitis C Ab: NONREACTIVE
SIGNAL TO CUT-OFF: 0.01 (ref <1.00)

## 2017-09-29 LAB — HIV ANTIBODY (ROUTINE TESTING W REFLEX): HIV 1&2 Ab, 4th Generation: NONREACTIVE

## 2017-09-29 LAB — THYROID PEROXIDASE ANTIBODY: Thyroperoxidase Ab SerPl-aCnc: 530 IU/mL — ABNORMAL HIGH (ref <9)

## 2017-10-03 DIAGNOSIS — M25571 Pain in right ankle and joints of right foot: Secondary | ICD-10-CM | POA: Insufficient documentation

## 2017-10-06 DIAGNOSIS — K429 Umbilical hernia without obstruction or gangrene: Secondary | ICD-10-CM | POA: Diagnosis not present

## 2017-11-02 DIAGNOSIS — M25569 Pain in unspecified knee: Secondary | ICD-10-CM | POA: Diagnosis not present

## 2017-11-02 DIAGNOSIS — N951 Menopausal and female climacteric states: Secondary | ICD-10-CM | POA: Diagnosis not present

## 2017-11-02 DIAGNOSIS — R197 Diarrhea, unspecified: Secondary | ICD-10-CM | POA: Diagnosis not present

## 2017-11-02 DIAGNOSIS — I10 Essential (primary) hypertension: Secondary | ICD-10-CM | POA: Diagnosis not present

## 2017-11-10 DIAGNOSIS — G501 Atypical facial pain: Secondary | ICD-10-CM | POA: Diagnosis not present

## 2017-11-10 DIAGNOSIS — H6990 Unspecified Eustachian tube disorder, unspecified ear: Secondary | ICD-10-CM | POA: Diagnosis not present

## 2017-11-12 DIAGNOSIS — R5383 Other fatigue: Secondary | ICD-10-CM | POA: Diagnosis not present

## 2017-11-12 DIAGNOSIS — E559 Vitamin D deficiency, unspecified: Secondary | ICD-10-CM | POA: Diagnosis not present

## 2017-11-12 DIAGNOSIS — R635 Abnormal weight gain: Secondary | ICD-10-CM | POA: Diagnosis not present

## 2017-11-12 DIAGNOSIS — R7309 Other abnormal glucose: Secondary | ICD-10-CM | POA: Diagnosis not present

## 2017-11-12 DIAGNOSIS — D509 Iron deficiency anemia, unspecified: Secondary | ICD-10-CM | POA: Diagnosis not present

## 2017-11-12 DIAGNOSIS — I10 Essential (primary) hypertension: Secondary | ICD-10-CM | POA: Diagnosis not present

## 2017-11-25 DIAGNOSIS — I10 Essential (primary) hypertension: Secondary | ICD-10-CM | POA: Diagnosis not present

## 2017-11-25 DIAGNOSIS — E039 Hypothyroidism, unspecified: Secondary | ICD-10-CM | POA: Diagnosis not present

## 2017-11-25 DIAGNOSIS — E063 Autoimmune thyroiditis: Secondary | ICD-10-CM | POA: Diagnosis not present

## 2017-11-25 DIAGNOSIS — R7982 Elevated C-reactive protein (CRP): Secondary | ICD-10-CM | POA: Diagnosis not present

## 2017-12-14 DIAGNOSIS — L853 Xerosis cutis: Secondary | ICD-10-CM | POA: Diagnosis not present

## 2017-12-14 DIAGNOSIS — L821 Other seborrheic keratosis: Secondary | ICD-10-CM | POA: Diagnosis not present

## 2017-12-14 DIAGNOSIS — L815 Leukoderma, not elsewhere classified: Secondary | ICD-10-CM | POA: Diagnosis not present

## 2017-12-14 DIAGNOSIS — E669 Obesity, unspecified: Secondary | ICD-10-CM | POA: Diagnosis not present

## 2017-12-14 DIAGNOSIS — B078 Other viral warts: Secondary | ICD-10-CM | POA: Diagnosis not present

## 2017-12-14 DIAGNOSIS — D485 Neoplasm of uncertain behavior of skin: Secondary | ICD-10-CM | POA: Diagnosis not present

## 2017-12-14 DIAGNOSIS — L570 Actinic keratosis: Secondary | ICD-10-CM | POA: Diagnosis not present

## 2017-12-15 DIAGNOSIS — I872 Venous insufficiency (chronic) (peripheral): Secondary | ICD-10-CM | POA: Diagnosis not present

## 2017-12-15 DIAGNOSIS — M7989 Other specified soft tissue disorders: Secondary | ICD-10-CM | POA: Diagnosis not present

## 2017-12-15 DIAGNOSIS — R5383 Other fatigue: Secondary | ICD-10-CM | POA: Diagnosis not present

## 2017-12-15 DIAGNOSIS — I83893 Varicose veins of bilateral lower extremities with other complications: Secondary | ICD-10-CM | POA: Diagnosis not present

## 2017-12-30 DIAGNOSIS — R7982 Elevated C-reactive protein (CRP): Secondary | ICD-10-CM | POA: Diagnosis not present

## 2017-12-30 DIAGNOSIS — I10 Essential (primary) hypertension: Secondary | ICD-10-CM | POA: Diagnosis not present

## 2017-12-30 DIAGNOSIS — E039 Hypothyroidism, unspecified: Secondary | ICD-10-CM | POA: Diagnosis not present

## 2017-12-30 DIAGNOSIS — E063 Autoimmune thyroiditis: Secondary | ICD-10-CM | POA: Diagnosis not present

## 2018-01-07 DIAGNOSIS — R9431 Abnormal electrocardiogram [ECG] [EKG]: Secondary | ICD-10-CM | POA: Diagnosis not present

## 2018-01-07 DIAGNOSIS — I10 Essential (primary) hypertension: Secondary | ICD-10-CM | POA: Diagnosis not present

## 2018-01-11 DIAGNOSIS — H47323 Drusen of optic disc, bilateral: Secondary | ICD-10-CM | POA: Diagnosis not present

## 2018-01-11 DIAGNOSIS — H35372 Puckering of macula, left eye: Secondary | ICD-10-CM | POA: Diagnosis not present

## 2018-01-11 DIAGNOSIS — H43821 Vitreomacular adhesion, right eye: Secondary | ICD-10-CM | POA: Diagnosis not present

## 2018-01-11 DIAGNOSIS — H35341 Macular cyst, hole, or pseudohole, right eye: Secondary | ICD-10-CM | POA: Diagnosis not present

## 2018-01-11 DIAGNOSIS — H43812 Vitreous degeneration, left eye: Secondary | ICD-10-CM | POA: Diagnosis not present

## 2018-01-12 DIAGNOSIS — I872 Venous insufficiency (chronic) (peripheral): Secondary | ICD-10-CM | POA: Diagnosis not present

## 2018-01-12 DIAGNOSIS — I83891 Varicose veins of right lower extremities with other complications: Secondary | ICD-10-CM | POA: Diagnosis not present

## 2018-01-13 DIAGNOSIS — I83892 Varicose veins of left lower extremities with other complications: Secondary | ICD-10-CM | POA: Diagnosis not present

## 2018-01-13 DIAGNOSIS — I872 Venous insufficiency (chronic) (peripheral): Secondary | ICD-10-CM | POA: Diagnosis not present

## 2018-01-18 DIAGNOSIS — Z8249 Family history of ischemic heart disease and other diseases of the circulatory system: Secondary | ICD-10-CM | POA: Diagnosis not present

## 2018-01-18 DIAGNOSIS — I10 Essential (primary) hypertension: Secondary | ICD-10-CM | POA: Diagnosis not present

## 2018-01-18 DIAGNOSIS — R9431 Abnormal electrocardiogram [ECG] [EKG]: Secondary | ICD-10-CM | POA: Diagnosis not present

## 2018-01-24 DIAGNOSIS — I83891 Varicose veins of right lower extremities with other complications: Secondary | ICD-10-CM | POA: Diagnosis not present

## 2018-01-24 DIAGNOSIS — I872 Venous insufficiency (chronic) (peripheral): Secondary | ICD-10-CM | POA: Diagnosis not present

## 2018-01-25 DIAGNOSIS — I872 Venous insufficiency (chronic) (peripheral): Secondary | ICD-10-CM | POA: Diagnosis not present

## 2018-01-25 DIAGNOSIS — I83891 Varicose veins of right lower extremities with other complications: Secondary | ICD-10-CM | POA: Diagnosis not present

## 2018-01-26 DIAGNOSIS — I83891 Varicose veins of right lower extremities with other complications: Secondary | ICD-10-CM | POA: Diagnosis not present

## 2018-01-26 DIAGNOSIS — I872 Venous insufficiency (chronic) (peripheral): Secondary | ICD-10-CM | POA: Diagnosis not present

## 2018-01-27 DIAGNOSIS — I872 Venous insufficiency (chronic) (peripheral): Secondary | ICD-10-CM | POA: Diagnosis not present

## 2018-01-27 DIAGNOSIS — I83892 Varicose veins of left lower extremities with other complications: Secondary | ICD-10-CM | POA: Diagnosis not present

## 2018-01-28 DIAGNOSIS — I872 Venous insufficiency (chronic) (peripheral): Secondary | ICD-10-CM | POA: Diagnosis not present

## 2018-01-28 DIAGNOSIS — I83892 Varicose veins of left lower extremities with other complications: Secondary | ICD-10-CM | POA: Diagnosis not present

## 2018-01-31 DIAGNOSIS — E063 Autoimmune thyroiditis: Secondary | ICD-10-CM | POA: Diagnosis not present

## 2018-01-31 DIAGNOSIS — E039 Hypothyroidism, unspecified: Secondary | ICD-10-CM | POA: Diagnosis not present

## 2018-01-31 DIAGNOSIS — F419 Anxiety disorder, unspecified: Secondary | ICD-10-CM | POA: Diagnosis not present

## 2018-01-31 DIAGNOSIS — I10 Essential (primary) hypertension: Secondary | ICD-10-CM | POA: Diagnosis not present

## 2018-01-31 DIAGNOSIS — I83891 Varicose veins of right lower extremities with other complications: Secondary | ICD-10-CM | POA: Diagnosis not present

## 2018-01-31 DIAGNOSIS — I872 Venous insufficiency (chronic) (peripheral): Secondary | ICD-10-CM | POA: Diagnosis not present

## 2018-01-31 DIAGNOSIS — R7982 Elevated C-reactive protein (CRP): Secondary | ICD-10-CM | POA: Diagnosis not present

## 2018-02-02 DIAGNOSIS — M7989 Other specified soft tissue disorders: Secondary | ICD-10-CM | POA: Diagnosis not present

## 2018-02-02 DIAGNOSIS — I872 Venous insufficiency (chronic) (peripheral): Secondary | ICD-10-CM | POA: Diagnosis not present

## 2018-02-02 DIAGNOSIS — R5383 Other fatigue: Secondary | ICD-10-CM | POA: Diagnosis not present

## 2018-02-07 DIAGNOSIS — M25569 Pain in unspecified knee: Secondary | ICD-10-CM | POA: Diagnosis not present

## 2018-02-07 DIAGNOSIS — R7982 Elevated C-reactive protein (CRP): Secondary | ICD-10-CM | POA: Diagnosis not present

## 2018-02-07 DIAGNOSIS — E039 Hypothyroidism, unspecified: Secondary | ICD-10-CM | POA: Diagnosis not present

## 2018-02-07 DIAGNOSIS — N951 Menopausal and female climacteric states: Secondary | ICD-10-CM | POA: Diagnosis not present

## 2018-02-08 DIAGNOSIS — S70362A Insect bite (nonvenomous), left thigh, initial encounter: Secondary | ICD-10-CM | POA: Diagnosis not present

## 2018-02-08 DIAGNOSIS — L568 Other specified acute skin changes due to ultraviolet radiation: Secondary | ICD-10-CM | POA: Diagnosis not present

## 2018-02-08 DIAGNOSIS — S70361A Insect bite (nonvenomous), right thigh, initial encounter: Secondary | ICD-10-CM | POA: Diagnosis not present

## 2018-02-08 DIAGNOSIS — S20369A Insect bite (nonvenomous) of unspecified front wall of thorax, initial encounter: Secondary | ICD-10-CM | POA: Diagnosis not present

## 2018-02-11 DIAGNOSIS — Z8249 Family history of ischemic heart disease and other diseases of the circulatory system: Secondary | ICD-10-CM | POA: Diagnosis not present

## 2018-02-11 DIAGNOSIS — I10 Essential (primary) hypertension: Secondary | ICD-10-CM | POA: Diagnosis not present

## 2018-02-11 DIAGNOSIS — R9431 Abnormal electrocardiogram [ECG] [EKG]: Secondary | ICD-10-CM | POA: Diagnosis not present

## 2018-02-11 DIAGNOSIS — I422 Other hypertrophic cardiomyopathy: Secondary | ICD-10-CM | POA: Diagnosis not present

## 2018-02-15 ENCOUNTER — Other Ambulatory Visit: Payer: Self-pay | Admitting: Family Medicine

## 2018-02-15 DIAGNOSIS — Z8249 Family history of ischemic heart disease and other diseases of the circulatory system: Secondary | ICD-10-CM | POA: Diagnosis not present

## 2018-02-15 DIAGNOSIS — R9431 Abnormal electrocardiogram [ECG] [EKG]: Secondary | ICD-10-CM | POA: Diagnosis not present

## 2018-02-15 DIAGNOSIS — I10 Essential (primary) hypertension: Secondary | ICD-10-CM | POA: Diagnosis not present

## 2018-03-03 DIAGNOSIS — M25569 Pain in unspecified knee: Secondary | ICD-10-CM | POA: Diagnosis not present

## 2018-03-03 DIAGNOSIS — M25462 Effusion, left knee: Secondary | ICD-10-CM | POA: Diagnosis not present

## 2018-03-07 DIAGNOSIS — F419 Anxiety disorder, unspecified: Secondary | ICD-10-CM | POA: Diagnosis not present

## 2018-03-08 DIAGNOSIS — H35372 Puckering of macula, left eye: Secondary | ICD-10-CM | POA: Diagnosis not present

## 2018-03-08 DIAGNOSIS — H35341 Macular cyst, hole, or pseudohole, right eye: Secondary | ICD-10-CM | POA: Diagnosis not present

## 2018-03-08 DIAGNOSIS — H43812 Vitreous degeneration, left eye: Secondary | ICD-10-CM | POA: Diagnosis not present

## 2018-03-08 DIAGNOSIS — H43821 Vitreomacular adhesion, right eye: Secondary | ICD-10-CM | POA: Diagnosis not present

## 2018-03-09 DIAGNOSIS — M25562 Pain in left knee: Secondary | ICD-10-CM | POA: Diagnosis not present

## 2018-03-09 DIAGNOSIS — M13861 Other specified arthritis, right knee: Secondary | ICD-10-CM | POA: Diagnosis not present

## 2018-03-09 DIAGNOSIS — M13862 Other specified arthritis, left knee: Secondary | ICD-10-CM | POA: Diagnosis not present

## 2018-03-11 DIAGNOSIS — M859 Disorder of bone density and structure, unspecified: Secondary | ICD-10-CM | POA: Diagnosis not present

## 2018-03-14 DIAGNOSIS — L820 Inflamed seborrheic keratosis: Secondary | ICD-10-CM | POA: Diagnosis not present

## 2018-03-14 DIAGNOSIS — D485 Neoplasm of uncertain behavior of skin: Secondary | ICD-10-CM | POA: Diagnosis not present

## 2018-03-14 DIAGNOSIS — M170 Bilateral primary osteoarthritis of knee: Secondary | ICD-10-CM | POA: Diagnosis not present

## 2018-03-14 DIAGNOSIS — D2272 Melanocytic nevi of left lower limb, including hip: Secondary | ICD-10-CM | POA: Diagnosis not present

## 2018-03-14 DIAGNOSIS — D2271 Melanocytic nevi of right lower limb, including hip: Secondary | ICD-10-CM | POA: Diagnosis not present

## 2018-03-14 DIAGNOSIS — Z1283 Encounter for screening for malignant neoplasm of skin: Secondary | ICD-10-CM | POA: Diagnosis not present

## 2018-03-14 DIAGNOSIS — L570 Actinic keratosis: Secondary | ICD-10-CM | POA: Diagnosis not present

## 2018-03-25 DIAGNOSIS — R5383 Other fatigue: Secondary | ICD-10-CM | POA: Diagnosis not present

## 2018-03-25 DIAGNOSIS — R7982 Elevated C-reactive protein (CRP): Secondary | ICD-10-CM | POA: Diagnosis not present

## 2018-03-25 DIAGNOSIS — R7309 Other abnormal glucose: Secondary | ICD-10-CM | POA: Diagnosis not present

## 2018-03-25 DIAGNOSIS — I10 Essential (primary) hypertension: Secondary | ICD-10-CM | POA: Diagnosis not present

## 2018-03-28 DIAGNOSIS — Z13820 Encounter for screening for osteoporosis: Secondary | ICD-10-CM | POA: Diagnosis not present

## 2018-03-28 DIAGNOSIS — R7982 Elevated C-reactive protein (CRP): Secondary | ICD-10-CM | POA: Diagnosis not present

## 2018-03-28 DIAGNOSIS — M25569 Pain in unspecified knee: Secondary | ICD-10-CM | POA: Diagnosis not present

## 2018-04-04 DIAGNOSIS — I872 Venous insufficiency (chronic) (peripheral): Secondary | ICD-10-CM | POA: Diagnosis not present

## 2018-04-04 DIAGNOSIS — M7989 Other specified soft tissue disorders: Secondary | ICD-10-CM | POA: Diagnosis not present

## 2018-04-05 DIAGNOSIS — H35372 Puckering of macula, left eye: Secondary | ICD-10-CM | POA: Diagnosis not present

## 2018-04-05 DIAGNOSIS — H43812 Vitreous degeneration, left eye: Secondary | ICD-10-CM | POA: Diagnosis not present

## 2018-04-05 DIAGNOSIS — H43821 Vitreomacular adhesion, right eye: Secondary | ICD-10-CM | POA: Diagnosis not present

## 2018-04-05 DIAGNOSIS — H35341 Macular cyst, hole, or pseudohole, right eye: Secondary | ICD-10-CM | POA: Diagnosis not present

## 2018-04-19 DIAGNOSIS — E669 Obesity, unspecified: Secondary | ICD-10-CM | POA: Diagnosis not present

## 2018-04-19 DIAGNOSIS — Z6841 Body Mass Index (BMI) 40.0 and over, adult: Secondary | ICD-10-CM | POA: Diagnosis not present

## 2018-04-19 DIAGNOSIS — H35341 Macular cyst, hole, or pseudohole, right eye: Secondary | ICD-10-CM | POA: Diagnosis not present

## 2018-04-19 DIAGNOSIS — I2589 Other forms of chronic ischemic heart disease: Secondary | ICD-10-CM | POA: Diagnosis not present

## 2018-04-19 DIAGNOSIS — I10 Essential (primary) hypertension: Secondary | ICD-10-CM | POA: Diagnosis not present

## 2018-04-20 DIAGNOSIS — I10 Essential (primary) hypertension: Secondary | ICD-10-CM | POA: Diagnosis not present

## 2018-04-20 DIAGNOSIS — E669 Obesity, unspecified: Secondary | ICD-10-CM | POA: Diagnosis not present

## 2018-04-20 DIAGNOSIS — H35371 Puckering of macula, right eye: Secondary | ICD-10-CM | POA: Diagnosis not present

## 2018-04-20 DIAGNOSIS — H35341 Macular cyst, hole, or pseudohole, right eye: Secondary | ICD-10-CM | POA: Diagnosis not present

## 2018-04-20 DIAGNOSIS — Z6841 Body Mass Index (BMI) 40.0 and over, adult: Secondary | ICD-10-CM | POA: Diagnosis not present

## 2018-04-26 ENCOUNTER — Other Ambulatory Visit: Payer: Self-pay | Admitting: Family Medicine

## 2018-04-26 NOTE — Telephone Encounter (Signed)
Patient need to schedule an ov for more refills. 

## 2018-04-28 DIAGNOSIS — M170 Bilateral primary osteoarthritis of knee: Secondary | ICD-10-CM | POA: Diagnosis not present

## 2018-05-28 DIAGNOSIS — N3000 Acute cystitis without hematuria: Secondary | ICD-10-CM | POA: Diagnosis not present

## 2018-06-02 DIAGNOSIS — H02834 Dermatochalasis of left upper eyelid: Secondary | ICD-10-CM | POA: Diagnosis not present

## 2018-06-02 DIAGNOSIS — H2513 Age-related nuclear cataract, bilateral: Secondary | ICD-10-CM | POA: Diagnosis not present

## 2018-06-02 DIAGNOSIS — H02403 Unspecified ptosis of bilateral eyelids: Secondary | ICD-10-CM | POA: Diagnosis not present

## 2018-06-02 DIAGNOSIS — H02831 Dermatochalasis of right upper eyelid: Secondary | ICD-10-CM | POA: Diagnosis not present

## 2018-06-20 DIAGNOSIS — F419 Anxiety disorder, unspecified: Secondary | ICD-10-CM | POA: Diagnosis not present

## 2018-07-11 DIAGNOSIS — H02831 Dermatochalasis of right upper eyelid: Secondary | ICD-10-CM | POA: Diagnosis not present

## 2018-07-11 DIAGNOSIS — H02834 Dermatochalasis of left upper eyelid: Secondary | ICD-10-CM | POA: Diagnosis not present

## 2018-07-11 DIAGNOSIS — D481 Neoplasm of uncertain behavior of connective and other soft tissue: Secondary | ICD-10-CM | POA: Diagnosis not present

## 2018-07-11 DIAGNOSIS — H02403 Unspecified ptosis of bilateral eyelids: Secondary | ICD-10-CM | POA: Diagnosis not present

## 2018-07-11 DIAGNOSIS — D23121 Other benign neoplasm of skin of left upper eyelid, including canthus: Secondary | ICD-10-CM | POA: Diagnosis not present

## 2018-07-11 DIAGNOSIS — H57813 Brow ptosis, bilateral: Secondary | ICD-10-CM | POA: Diagnosis not present

## 2018-07-11 DIAGNOSIS — H53453 Other localized visual field defect, bilateral: Secondary | ICD-10-CM | POA: Diagnosis not present

## 2018-07-11 DIAGNOSIS — G4733 Obstructive sleep apnea (adult) (pediatric): Secondary | ICD-10-CM | POA: Diagnosis not present

## 2018-07-26 DIAGNOSIS — H35372 Puckering of macula, left eye: Secondary | ICD-10-CM | POA: Diagnosis not present

## 2018-07-26 DIAGNOSIS — H43821 Vitreomacular adhesion, right eye: Secondary | ICD-10-CM | POA: Diagnosis not present

## 2018-07-26 DIAGNOSIS — H35341 Macular cyst, hole, or pseudohole, right eye: Secondary | ICD-10-CM | POA: Diagnosis not present

## 2018-07-26 DIAGNOSIS — H2513 Age-related nuclear cataract, bilateral: Secondary | ICD-10-CM | POA: Diagnosis not present

## 2018-09-07 DIAGNOSIS — I83893 Varicose veins of bilateral lower extremities with other complications: Secondary | ICD-10-CM | POA: Diagnosis not present

## 2018-09-07 DIAGNOSIS — M7989 Other specified soft tissue disorders: Secondary | ICD-10-CM | POA: Diagnosis not present

## 2018-09-07 DIAGNOSIS — R5383 Other fatigue: Secondary | ICD-10-CM | POA: Diagnosis not present

## 2018-09-07 DIAGNOSIS — I872 Venous insufficiency (chronic) (peripheral): Secondary | ICD-10-CM | POA: Diagnosis not present

## 2018-09-14 DIAGNOSIS — I872 Venous insufficiency (chronic) (peripheral): Secondary | ICD-10-CM | POA: Diagnosis not present

## 2018-09-14 DIAGNOSIS — I83893 Varicose veins of bilateral lower extremities with other complications: Secondary | ICD-10-CM | POA: Diagnosis not present

## 2018-09-14 DIAGNOSIS — M7989 Other specified soft tissue disorders: Secondary | ICD-10-CM | POA: Diagnosis not present

## 2018-09-14 DIAGNOSIS — R5383 Other fatigue: Secondary | ICD-10-CM | POA: Diagnosis not present

## 2018-09-22 DIAGNOSIS — M170 Bilateral primary osteoarthritis of knee: Secondary | ICD-10-CM | POA: Diagnosis not present

## 2018-09-22 DIAGNOSIS — M25561 Pain in right knee: Secondary | ICD-10-CM | POA: Diagnosis not present

## 2018-09-22 DIAGNOSIS — M25562 Pain in left knee: Secondary | ICD-10-CM | POA: Diagnosis not present

## 2018-09-28 DIAGNOSIS — H35372 Puckering of macula, left eye: Secondary | ICD-10-CM | POA: Diagnosis not present

## 2018-09-28 DIAGNOSIS — H43821 Vitreomacular adhesion, right eye: Secondary | ICD-10-CM | POA: Diagnosis not present

## 2018-09-28 DIAGNOSIS — H2513 Age-related nuclear cataract, bilateral: Secondary | ICD-10-CM | POA: Diagnosis not present

## 2018-10-05 DIAGNOSIS — M1711 Unilateral primary osteoarthritis, right knee: Secondary | ICD-10-CM | POA: Diagnosis not present

## 2018-10-05 DIAGNOSIS — M1712 Unilateral primary osteoarthritis, left knee: Secondary | ICD-10-CM | POA: Diagnosis not present

## 2018-10-27 DIAGNOSIS — M170 Bilateral primary osteoarthritis of knee: Secondary | ICD-10-CM | POA: Diagnosis not present

## 2018-11-09 DIAGNOSIS — R7303 Prediabetes: Secondary | ICD-10-CM | POA: Diagnosis not present

## 2018-11-09 DIAGNOSIS — E042 Nontoxic multinodular goiter: Secondary | ICD-10-CM | POA: Diagnosis not present

## 2018-11-09 DIAGNOSIS — I10 Essential (primary) hypertension: Secondary | ICD-10-CM | POA: Diagnosis not present

## 2018-11-09 DIAGNOSIS — E063 Autoimmune thyroiditis: Secondary | ICD-10-CM | POA: Diagnosis not present

## 2018-11-29 DIAGNOSIS — H35341 Macular cyst, hole, or pseudohole, right eye: Secondary | ICD-10-CM | POA: Diagnosis not present

## 2018-11-29 DIAGNOSIS — H35372 Puckering of macula, left eye: Secondary | ICD-10-CM | POA: Diagnosis not present

## 2018-11-29 DIAGNOSIS — H2513 Age-related nuclear cataract, bilateral: Secondary | ICD-10-CM | POA: Diagnosis not present

## 2018-12-02 DIAGNOSIS — H35373 Puckering of macula, bilateral: Secondary | ICD-10-CM | POA: Diagnosis not present

## 2018-12-02 DIAGNOSIS — H2511 Age-related nuclear cataract, right eye: Secondary | ICD-10-CM | POA: Diagnosis not present

## 2018-12-02 DIAGNOSIS — H2512 Age-related nuclear cataract, left eye: Secondary | ICD-10-CM | POA: Diagnosis not present

## 2018-12-13 DIAGNOSIS — R7309 Other abnormal glucose: Secondary | ICD-10-CM | POA: Diagnosis not present

## 2018-12-13 DIAGNOSIS — D509 Iron deficiency anemia, unspecified: Secondary | ICD-10-CM | POA: Diagnosis not present

## 2018-12-13 DIAGNOSIS — E039 Hypothyroidism, unspecified: Secondary | ICD-10-CM | POA: Diagnosis not present

## 2018-12-13 DIAGNOSIS — I10 Essential (primary) hypertension: Secondary | ICD-10-CM | POA: Diagnosis not present

## 2018-12-21 DIAGNOSIS — Z6841 Body Mass Index (BMI) 40.0 and over, adult: Secondary | ICD-10-CM | POA: Diagnosis not present

## 2018-12-21 DIAGNOSIS — H269 Unspecified cataract: Secondary | ICD-10-CM | POA: Diagnosis not present

## 2018-12-21 DIAGNOSIS — I10 Essential (primary) hypertension: Secondary | ICD-10-CM | POA: Diagnosis not present

## 2018-12-21 DIAGNOSIS — Z01810 Encounter for preprocedural cardiovascular examination: Secondary | ICD-10-CM | POA: Diagnosis not present

## 2018-12-26 DIAGNOSIS — H2511 Age-related nuclear cataract, right eye: Secondary | ICD-10-CM | POA: Diagnosis not present

## 2018-12-29 DIAGNOSIS — H2511 Age-related nuclear cataract, right eye: Secondary | ICD-10-CM | POA: Diagnosis not present

## 2019-01-04 DIAGNOSIS — Z1231 Encounter for screening mammogram for malignant neoplasm of breast: Secondary | ICD-10-CM | POA: Diagnosis not present

## 2019-01-05 DIAGNOSIS — H2511 Age-related nuclear cataract, right eye: Secondary | ICD-10-CM | POA: Diagnosis not present

## 2019-01-05 DIAGNOSIS — R9431 Abnormal electrocardiogram [ECG] [EKG]: Secondary | ICD-10-CM | POA: Diagnosis not present

## 2019-01-05 DIAGNOSIS — I10 Essential (primary) hypertension: Secondary | ICD-10-CM | POA: Diagnosis not present

## 2019-01-12 DIAGNOSIS — M545 Low back pain: Secondary | ICD-10-CM | POA: Diagnosis not present

## 2019-01-12 DIAGNOSIS — M9902 Segmental and somatic dysfunction of thoracic region: Secondary | ICD-10-CM | POA: Diagnosis not present

## 2019-01-12 DIAGNOSIS — M546 Pain in thoracic spine: Secondary | ICD-10-CM | POA: Diagnosis not present

## 2019-01-12 DIAGNOSIS — M9903 Segmental and somatic dysfunction of lumbar region: Secondary | ICD-10-CM | POA: Diagnosis not present

## 2019-01-13 DIAGNOSIS — M546 Pain in thoracic spine: Secondary | ICD-10-CM | POA: Diagnosis not present

## 2019-01-13 DIAGNOSIS — M9902 Segmental and somatic dysfunction of thoracic region: Secondary | ICD-10-CM | POA: Diagnosis not present

## 2019-01-13 DIAGNOSIS — M9903 Segmental and somatic dysfunction of lumbar region: Secondary | ICD-10-CM | POA: Diagnosis not present

## 2019-01-13 DIAGNOSIS — M545 Low back pain: Secondary | ICD-10-CM | POA: Diagnosis not present

## 2019-01-27 DIAGNOSIS — R7982 Elevated C-reactive protein (CRP): Secondary | ICD-10-CM | POA: Diagnosis not present

## 2019-01-27 DIAGNOSIS — I10 Essential (primary) hypertension: Secondary | ICD-10-CM | POA: Diagnosis not present

## 2019-01-27 DIAGNOSIS — R9431 Abnormal electrocardiogram [ECG] [EKG]: Secondary | ICD-10-CM | POA: Diagnosis not present

## 2019-01-27 DIAGNOSIS — E039 Hypothyroidism, unspecified: Secondary | ICD-10-CM | POA: Diagnosis not present

## 2019-02-03 DIAGNOSIS — Z0189 Encounter for other specified special examinations: Secondary | ICD-10-CM | POA: Diagnosis not present

## 2019-02-06 DIAGNOSIS — Z1212 Encounter for screening for malignant neoplasm of rectum: Secondary | ICD-10-CM | POA: Diagnosis not present

## 2019-02-06 DIAGNOSIS — Z1211 Encounter for screening for malignant neoplasm of colon: Secondary | ICD-10-CM | POA: Diagnosis not present

## 2019-02-07 DIAGNOSIS — R300 Dysuria: Secondary | ICD-10-CM | POA: Diagnosis not present

## 2019-02-07 DIAGNOSIS — H2511 Age-related nuclear cataract, right eye: Secondary | ICD-10-CM | POA: Diagnosis not present

## 2019-02-07 DIAGNOSIS — E063 Autoimmune thyroiditis: Secondary | ICD-10-CM | POA: Diagnosis not present

## 2019-02-07 DIAGNOSIS — N390 Urinary tract infection, site not specified: Secondary | ICD-10-CM | POA: Diagnosis not present

## 2019-02-09 DIAGNOSIS — R7303 Prediabetes: Secondary | ICD-10-CM | POA: Diagnosis not present

## 2019-02-09 DIAGNOSIS — I10 Essential (primary) hypertension: Secondary | ICD-10-CM | POA: Diagnosis not present

## 2019-02-09 DIAGNOSIS — E042 Nontoxic multinodular goiter: Secondary | ICD-10-CM | POA: Diagnosis not present

## 2019-02-09 DIAGNOSIS — E063 Autoimmune thyroiditis: Secondary | ICD-10-CM | POA: Diagnosis not present

## 2019-02-10 DIAGNOSIS — I10 Essential (primary) hypertension: Secondary | ICD-10-CM | POA: Diagnosis not present

## 2019-02-10 DIAGNOSIS — F419 Anxiety disorder, unspecified: Secondary | ICD-10-CM | POA: Diagnosis not present

## 2019-02-10 DIAGNOSIS — R7982 Elevated C-reactive protein (CRP): Secondary | ICD-10-CM | POA: Diagnosis not present

## 2019-02-10 DIAGNOSIS — I499 Cardiac arrhythmia, unspecified: Secondary | ICD-10-CM | POA: Diagnosis not present

## 2019-02-13 DIAGNOSIS — R7982 Elevated C-reactive protein (CRP): Secondary | ICD-10-CM | POA: Diagnosis not present

## 2019-02-13 DIAGNOSIS — K760 Fatty (change of) liver, not elsewhere classified: Secondary | ICD-10-CM | POA: Diagnosis not present

## 2019-02-13 DIAGNOSIS — R197 Diarrhea, unspecified: Secondary | ICD-10-CM | POA: Diagnosis not present

## 2019-02-13 DIAGNOSIS — R300 Dysuria: Secondary | ICD-10-CM | POA: Diagnosis not present

## 2019-02-25 DIAGNOSIS — R9431 Abnormal electrocardiogram [ECG] [EKG]: Secondary | ICD-10-CM | POA: Diagnosis not present

## 2019-02-25 DIAGNOSIS — I10 Essential (primary) hypertension: Secondary | ICD-10-CM | POA: Diagnosis not present

## 2019-02-25 DIAGNOSIS — Z136 Encounter for screening for cardiovascular disorders: Secondary | ICD-10-CM | POA: Diagnosis not present

## 2019-03-10 DIAGNOSIS — I10 Essential (primary) hypertension: Secondary | ICD-10-CM | POA: Diagnosis not present

## 2019-03-10 DIAGNOSIS — Z6841 Body Mass Index (BMI) 40.0 and over, adult: Secondary | ICD-10-CM | POA: Diagnosis not present

## 2019-03-10 DIAGNOSIS — Z1211 Encounter for screening for malignant neoplasm of colon: Secondary | ICD-10-CM | POA: Diagnosis not present

## 2019-03-10 DIAGNOSIS — R9431 Abnormal electrocardiogram [ECG] [EKG]: Secondary | ICD-10-CM | POA: Diagnosis not present

## 2019-03-29 DIAGNOSIS — M4312 Spondylolisthesis, cervical region: Secondary | ICD-10-CM | POA: Diagnosis not present

## 2019-03-29 DIAGNOSIS — M2428 Disorder of ligament, vertebrae: Secondary | ICD-10-CM | POA: Diagnosis not present

## 2019-03-29 DIAGNOSIS — M545 Low back pain: Secondary | ICD-10-CM | POA: Diagnosis not present

## 2019-03-29 DIAGNOSIS — R292 Abnormal reflex: Secondary | ICD-10-CM | POA: Diagnosis not present

## 2019-03-30 DIAGNOSIS — M25562 Pain in left knee: Secondary | ICD-10-CM | POA: Diagnosis not present

## 2019-03-30 DIAGNOSIS — M25561 Pain in right knee: Secondary | ICD-10-CM | POA: Diagnosis not present

## 2019-03-30 DIAGNOSIS — M170 Bilateral primary osteoarthritis of knee: Secondary | ICD-10-CM | POA: Diagnosis not present

## 2019-04-03 DIAGNOSIS — R292 Abnormal reflex: Secondary | ICD-10-CM | POA: Diagnosis not present

## 2019-04-03 DIAGNOSIS — M545 Low back pain: Secondary | ICD-10-CM | POA: Diagnosis not present

## 2019-04-03 DIAGNOSIS — M2428 Disorder of ligament, vertebrae: Secondary | ICD-10-CM | POA: Diagnosis not present

## 2019-04-03 DIAGNOSIS — M4312 Spondylolisthesis, cervical region: Secondary | ICD-10-CM | POA: Diagnosis not present

## 2019-04-17 DIAGNOSIS — M545 Low back pain: Secondary | ICD-10-CM | POA: Diagnosis not present

## 2019-04-17 DIAGNOSIS — R292 Abnormal reflex: Secondary | ICD-10-CM | POA: Diagnosis not present

## 2019-04-17 DIAGNOSIS — E039 Hypothyroidism, unspecified: Secondary | ICD-10-CM | POA: Diagnosis not present

## 2019-04-17 DIAGNOSIS — M4312 Spondylolisthesis, cervical region: Secondary | ICD-10-CM | POA: Diagnosis not present

## 2019-04-17 DIAGNOSIS — M2428 Disorder of ligament, vertebrae: Secondary | ICD-10-CM | POA: Diagnosis not present

## 2019-04-17 DIAGNOSIS — I10 Essential (primary) hypertension: Secondary | ICD-10-CM | POA: Diagnosis not present

## 2019-04-17 DIAGNOSIS — R7982 Elevated C-reactive protein (CRP): Secondary | ICD-10-CM | POA: Diagnosis not present

## 2019-04-25 DIAGNOSIS — C44519 Basal cell carcinoma of skin of other part of trunk: Secondary | ICD-10-CM | POA: Diagnosis not present

## 2019-04-25 DIAGNOSIS — D485 Neoplasm of uncertain behavior of skin: Secondary | ICD-10-CM | POA: Diagnosis not present

## 2019-04-25 DIAGNOSIS — L821 Other seborrheic keratosis: Secondary | ICD-10-CM | POA: Diagnosis not present

## 2019-04-25 DIAGNOSIS — L814 Other melanin hyperpigmentation: Secondary | ICD-10-CM | POA: Diagnosis not present

## 2019-04-25 DIAGNOSIS — D225 Melanocytic nevi of trunk: Secondary | ICD-10-CM | POA: Diagnosis not present

## 2019-04-25 DIAGNOSIS — L281 Prurigo nodularis: Secondary | ICD-10-CM | POA: Diagnosis not present

## 2019-05-01 DIAGNOSIS — M170 Bilateral primary osteoarthritis of knee: Secondary | ICD-10-CM | POA: Diagnosis not present

## 2019-05-01 DIAGNOSIS — M2428 Disorder of ligament, vertebrae: Secondary | ICD-10-CM | POA: Diagnosis not present

## 2019-05-01 DIAGNOSIS — M545 Low back pain: Secondary | ICD-10-CM | POA: Diagnosis not present

## 2019-05-01 DIAGNOSIS — R292 Abnormal reflex: Secondary | ICD-10-CM | POA: Diagnosis not present

## 2019-05-01 DIAGNOSIS — M4312 Spondylolisthesis, cervical region: Secondary | ICD-10-CM | POA: Diagnosis not present

## 2019-05-02 DIAGNOSIS — H33001 Unspecified retinal detachment with retinal break, right eye: Secondary | ICD-10-CM | POA: Diagnosis not present

## 2019-05-02 DIAGNOSIS — H35372 Puckering of macula, left eye: Secondary | ICD-10-CM | POA: Diagnosis not present

## 2019-05-02 DIAGNOSIS — H31011 Macula scars of posterior pole (postinflammatory) (post-traumatic), right eye: Secondary | ICD-10-CM | POA: Diagnosis not present

## 2019-05-02 DIAGNOSIS — H43812 Vitreous degeneration, left eye: Secondary | ICD-10-CM | POA: Diagnosis not present

## 2019-05-15 DIAGNOSIS — M62838 Other muscle spasm: Secondary | ICD-10-CM | POA: Diagnosis not present

## 2019-05-15 DIAGNOSIS — M5412 Radiculopathy, cervical region: Secondary | ICD-10-CM | POA: Diagnosis not present

## 2019-05-15 DIAGNOSIS — M4722 Other spondylosis with radiculopathy, cervical region: Secondary | ICD-10-CM | POA: Diagnosis not present

## 2019-05-16 DIAGNOSIS — C44519 Basal cell carcinoma of skin of other part of trunk: Secondary | ICD-10-CM | POA: Diagnosis not present

## 2019-05-16 DIAGNOSIS — M7732 Calcaneal spur, left foot: Secondary | ICD-10-CM | POA: Diagnosis not present

## 2019-05-16 DIAGNOSIS — M24072 Loose body in left ankle: Secondary | ICD-10-CM | POA: Diagnosis not present

## 2019-05-16 DIAGNOSIS — M1712 Unilateral primary osteoarthritis, left knee: Secondary | ICD-10-CM | POA: Diagnosis not present

## 2019-05-16 DIAGNOSIS — M25462 Effusion, left knee: Secondary | ICD-10-CM | POA: Diagnosis not present

## 2019-05-16 DIAGNOSIS — L814 Other melanin hyperpigmentation: Secondary | ICD-10-CM | POA: Diagnosis not present

## 2019-05-16 DIAGNOSIS — M778 Other enthesopathies, not elsewhere classified: Secondary | ICD-10-CM | POA: Diagnosis not present

## 2019-05-16 DIAGNOSIS — M2012 Hallux valgus (acquired), left foot: Secondary | ICD-10-CM | POA: Diagnosis not present

## 2019-05-16 DIAGNOSIS — M21162 Varus deformity, not elsewhere classified, left knee: Secondary | ICD-10-CM | POA: Diagnosis not present

## 2019-05-16 DIAGNOSIS — M79672 Pain in left foot: Secondary | ICD-10-CM | POA: Diagnosis not present

## 2019-05-16 DIAGNOSIS — M179 Osteoarthritis of knee, unspecified: Secondary | ICD-10-CM | POA: Diagnosis not present

## 2019-05-16 DIAGNOSIS — M25461 Effusion, right knee: Secondary | ICD-10-CM | POA: Diagnosis not present

## 2019-05-16 DIAGNOSIS — M21161 Varus deformity, not elsewhere classified, right knee: Secondary | ICD-10-CM | POA: Diagnosis not present

## 2019-05-16 DIAGNOSIS — M1711 Unilateral primary osteoarthritis, right knee: Secondary | ICD-10-CM | POA: Diagnosis not present

## 2019-05-16 DIAGNOSIS — L905 Scar conditions and fibrosis of skin: Secondary | ICD-10-CM | POA: Diagnosis not present

## 2019-05-16 DIAGNOSIS — M19072 Primary osteoarthritis, left ankle and foot: Secondary | ICD-10-CM | POA: Diagnosis not present

## 2019-05-16 DIAGNOSIS — L578 Other skin changes due to chronic exposure to nonionizing radiation: Secondary | ICD-10-CM | POA: Diagnosis not present

## 2019-05-18 DIAGNOSIS — Z7189 Other specified counseling: Secondary | ICD-10-CM | POA: Diagnosis not present

## 2019-05-18 DIAGNOSIS — Z6841 Body Mass Index (BMI) 40.0 and over, adult: Secondary | ICD-10-CM | POA: Diagnosis not present

## 2019-05-26 DIAGNOSIS — H02833 Dermatochalasis of right eye, unspecified eyelid: Secondary | ICD-10-CM | POA: Diagnosis not present

## 2019-05-26 DIAGNOSIS — H2512 Age-related nuclear cataract, left eye: Secondary | ICD-10-CM | POA: Diagnosis not present

## 2019-05-26 DIAGNOSIS — H524 Presbyopia: Secondary | ICD-10-CM | POA: Diagnosis not present

## 2019-05-26 DIAGNOSIS — H02836 Dermatochalasis of left eye, unspecified eyelid: Secondary | ICD-10-CM | POA: Diagnosis not present

## 2019-05-29 DIAGNOSIS — M5412 Radiculopathy, cervical region: Secondary | ICD-10-CM | POA: Diagnosis not present

## 2019-05-29 DIAGNOSIS — M5135 Other intervertebral disc degeneration, thoracolumbar region: Secondary | ICD-10-CM | POA: Diagnosis not present

## 2019-05-31 DIAGNOSIS — M5135 Other intervertebral disc degeneration, thoracolumbar region: Secondary | ICD-10-CM | POA: Diagnosis not present

## 2019-05-31 DIAGNOSIS — M5412 Radiculopathy, cervical region: Secondary | ICD-10-CM | POA: Diagnosis not present

## 2019-06-02 DIAGNOSIS — M5135 Other intervertebral disc degeneration, thoracolumbar region: Secondary | ICD-10-CM | POA: Diagnosis not present

## 2019-06-02 DIAGNOSIS — M5412 Radiculopathy, cervical region: Secondary | ICD-10-CM | POA: Diagnosis not present

## 2019-06-05 DIAGNOSIS — M5412 Radiculopathy, cervical region: Secondary | ICD-10-CM | POA: Diagnosis not present

## 2019-06-05 DIAGNOSIS — M5135 Other intervertebral disc degeneration, thoracolumbar region: Secondary | ICD-10-CM | POA: Diagnosis not present

## 2019-06-06 DIAGNOSIS — M19072 Primary osteoarthritis, left ankle and foot: Secondary | ICD-10-CM | POA: Diagnosis not present

## 2019-06-06 DIAGNOSIS — M7989 Other specified soft tissue disorders: Secondary | ICD-10-CM | POA: Diagnosis not present

## 2019-06-06 DIAGNOSIS — M2041 Other hammer toe(s) (acquired), right foot: Secondary | ICD-10-CM | POA: Diagnosis not present

## 2019-06-06 DIAGNOSIS — M79672 Pain in left foot: Secondary | ICD-10-CM | POA: Diagnosis not present

## 2019-06-07 DIAGNOSIS — M5412 Radiculopathy, cervical region: Secondary | ICD-10-CM | POA: Diagnosis not present

## 2019-06-07 DIAGNOSIS — M5135 Other intervertebral disc degeneration, thoracolumbar region: Secondary | ICD-10-CM | POA: Diagnosis not present

## 2019-06-08 DIAGNOSIS — M5412 Radiculopathy, cervical region: Secondary | ICD-10-CM | POA: Diagnosis not present

## 2019-06-13 DIAGNOSIS — M5135 Other intervertebral disc degeneration, thoracolumbar region: Secondary | ICD-10-CM | POA: Diagnosis not present

## 2019-06-13 DIAGNOSIS — M5412 Radiculopathy, cervical region: Secondary | ICD-10-CM | POA: Diagnosis not present

## 2019-06-16 DIAGNOSIS — M5412 Radiculopathy, cervical region: Secondary | ICD-10-CM | POA: Diagnosis not present

## 2019-06-16 DIAGNOSIS — M5135 Other intervertebral disc degeneration, thoracolumbar region: Secondary | ICD-10-CM | POA: Diagnosis not present

## 2019-06-19 DIAGNOSIS — M5135 Other intervertebral disc degeneration, thoracolumbar region: Secondary | ICD-10-CM | POA: Diagnosis not present

## 2019-06-19 DIAGNOSIS — M5412 Radiculopathy, cervical region: Secondary | ICD-10-CM | POA: Diagnosis not present

## 2019-06-21 DIAGNOSIS — M5412 Radiculopathy, cervical region: Secondary | ICD-10-CM | POA: Diagnosis not present

## 2019-06-21 DIAGNOSIS — M5135 Other intervertebral disc degeneration, thoracolumbar region: Secondary | ICD-10-CM | POA: Diagnosis not present

## 2019-06-22 DIAGNOSIS — M5412 Radiculopathy, cervical region: Secondary | ICD-10-CM | POA: Diagnosis not present

## 2019-06-22 DIAGNOSIS — M4722 Other spondylosis with radiculopathy, cervical region: Secondary | ICD-10-CM | POA: Diagnosis not present

## 2019-06-22 DIAGNOSIS — M5416 Radiculopathy, lumbar region: Secondary | ICD-10-CM | POA: Diagnosis not present

## 2019-06-22 DIAGNOSIS — M542 Cervicalgia: Secondary | ICD-10-CM | POA: Diagnosis not present

## 2019-06-28 DIAGNOSIS — H02831 Dermatochalasis of right upper eyelid: Secondary | ICD-10-CM | POA: Diagnosis not present

## 2019-06-28 DIAGNOSIS — H02834 Dermatochalasis of left upper eyelid: Secondary | ICD-10-CM | POA: Diagnosis not present

## 2019-07-04 DIAGNOSIS — M5416 Radiculopathy, lumbar region: Secondary | ICD-10-CM | POA: Diagnosis not present

## 2019-07-20 DIAGNOSIS — M5416 Radiculopathy, lumbar region: Secondary | ICD-10-CM | POA: Diagnosis not present

## 2019-07-25 DIAGNOSIS — H53451 Other localized visual field defect, right eye: Secondary | ICD-10-CM | POA: Diagnosis not present

## 2019-07-25 DIAGNOSIS — H02401 Unspecified ptosis of right eyelid: Secondary | ICD-10-CM | POA: Diagnosis not present

## 2019-08-17 DIAGNOSIS — M5416 Radiculopathy, lumbar region: Secondary | ICD-10-CM | POA: Diagnosis not present

## 2019-08-17 DIAGNOSIS — H04031 Chronic enlargement of right lacrimal gland: Secondary | ICD-10-CM | POA: Diagnosis not present

## 2019-08-17 DIAGNOSIS — M542 Cervicalgia: Secondary | ICD-10-CM | POA: Diagnosis not present

## 2019-08-17 DIAGNOSIS — M5412 Radiculopathy, cervical region: Secondary | ICD-10-CM | POA: Diagnosis not present

## 2019-08-17 DIAGNOSIS — M4722 Other spondylosis with radiculopathy, cervical region: Secondary | ICD-10-CM | POA: Diagnosis not present

## 2019-09-01 DIAGNOSIS — Z1389 Encounter for screening for other disorder: Secondary | ICD-10-CM | POA: Diagnosis not present

## 2019-09-01 DIAGNOSIS — H04031 Chronic enlargement of right lacrimal gland: Secondary | ICD-10-CM | POA: Diagnosis not present

## 2019-09-07 DIAGNOSIS — H04031 Chronic enlargement of right lacrimal gland: Secondary | ICD-10-CM | POA: Diagnosis not present

## 2019-09-27 DIAGNOSIS — F4320 Adjustment disorder, unspecified: Secondary | ICD-10-CM | POA: Diagnosis not present

## 2019-09-28 DIAGNOSIS — F4320 Adjustment disorder, unspecified: Secondary | ICD-10-CM | POA: Diagnosis not present

## 2019-10-10 DIAGNOSIS — M542 Cervicalgia: Secondary | ICD-10-CM | POA: Diagnosis not present

## 2019-10-10 DIAGNOSIS — M5416 Radiculopathy, lumbar region: Secondary | ICD-10-CM | POA: Diagnosis not present

## 2019-10-10 DIAGNOSIS — M4722 Other spondylosis with radiculopathy, cervical region: Secondary | ICD-10-CM | POA: Diagnosis not present

## 2019-10-10 DIAGNOSIS — M62838 Other muscle spasm: Secondary | ICD-10-CM | POA: Diagnosis not present

## 2019-10-13 DIAGNOSIS — H33001 Unspecified retinal detachment with retinal break, right eye: Secondary | ICD-10-CM | POA: Diagnosis not present

## 2019-10-13 DIAGNOSIS — H2512 Age-related nuclear cataract, left eye: Secondary | ICD-10-CM | POA: Diagnosis not present

## 2019-10-13 DIAGNOSIS — H43812 Vitreous degeneration, left eye: Secondary | ICD-10-CM | POA: Diagnosis not present

## 2019-10-13 DIAGNOSIS — H35372 Puckering of macula, left eye: Secondary | ICD-10-CM | POA: Diagnosis not present

## 2019-10-13 DIAGNOSIS — H31011 Macula scars of posterior pole (postinflammatory) (post-traumatic), right eye: Secondary | ICD-10-CM | POA: Diagnosis not present

## 2019-10-27 DIAGNOSIS — J341 Cyst and mucocele of nose and nasal sinus: Secondary | ICD-10-CM | POA: Diagnosis not present

## 2019-10-27 DIAGNOSIS — I780 Hereditary hemorrhagic telangiectasia: Secondary | ICD-10-CM | POA: Diagnosis not present

## 2019-11-02 DIAGNOSIS — L570 Actinic keratosis: Secondary | ICD-10-CM | POA: Diagnosis not present

## 2019-11-02 DIAGNOSIS — R21 Rash and other nonspecific skin eruption: Secondary | ICD-10-CM | POA: Diagnosis not present

## 2019-11-02 DIAGNOSIS — D485 Neoplasm of uncertain behavior of skin: Secondary | ICD-10-CM | POA: Diagnosis not present

## 2019-11-02 DIAGNOSIS — H04031 Chronic enlargement of right lacrimal gland: Secondary | ICD-10-CM | POA: Diagnosis not present

## 2019-11-02 DIAGNOSIS — L281 Prurigo nodularis: Secondary | ICD-10-CM | POA: Diagnosis not present

## 2019-11-02 DIAGNOSIS — D235 Other benign neoplasm of skin of trunk: Secondary | ICD-10-CM | POA: Diagnosis not present

## 2019-11-02 DIAGNOSIS — L814 Other melanin hyperpigmentation: Secondary | ICD-10-CM | POA: Diagnosis not present

## 2019-11-02 DIAGNOSIS — D225 Melanocytic nevi of trunk: Secondary | ICD-10-CM | POA: Diagnosis not present

## 2019-11-02 DIAGNOSIS — F4323 Adjustment disorder with mixed anxiety and depressed mood: Secondary | ICD-10-CM | POA: Diagnosis not present

## 2019-11-07 IMAGING — CT CT ABD-PELV W/ CM
2 of 8 series · 14 of 46 positions shown, 18 images · IV contrast (ISOVUE 300)
Comparison: CT abdomen pelvis of 05/14/2004.

CLINICAL DATA: History of hiatal hernia repair in Thursday December, 2015,
pain in the mid abdomen since [REDACTED] after lifting

EXAM:
CT ABDOMEN AND PELVIS WITH CONTRAST
TECHNIQUE: Multidetector CT imaging of the abdomen and pelvis was performed
using the standard protocol following bolus administration of
intravenous contrast.
CONTRAST:  100mL SBOA9P-M22 IOPAMIDOL (SBOA9P-M22) INJECTION 61%

[Series 2: abd/pel w · axial · 0.85mm/px · z∈[-406,-11]mm · 11 of 94 slices shown, 15 images]
[im 10/94  soft-tissue]
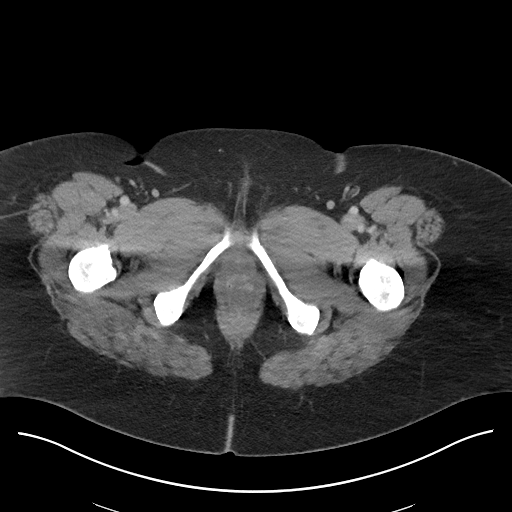
[im 10/94  bone]
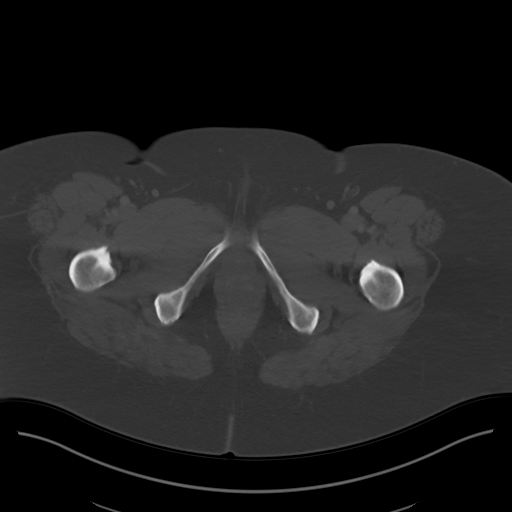
[im 20/94  soft-tissue]
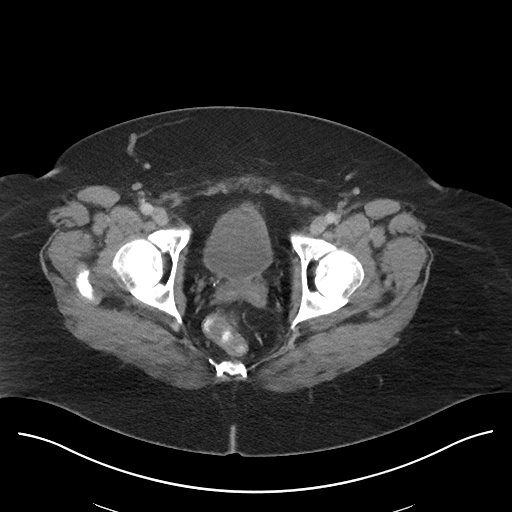
[im 30/94  soft-tissue]
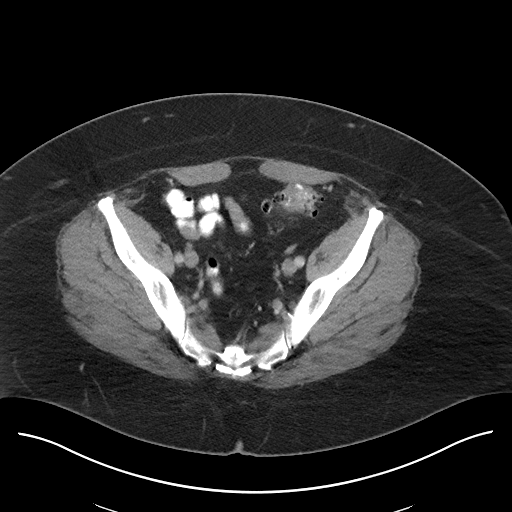
[im 40/94  soft-tissue]
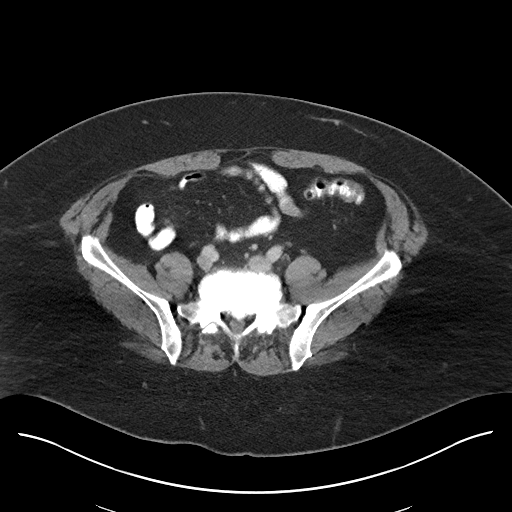
[im 49/94  soft-tissue]
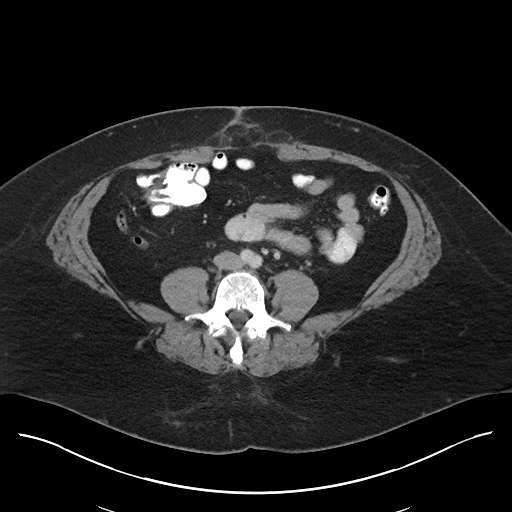
[im 59/94  soft-tissue]
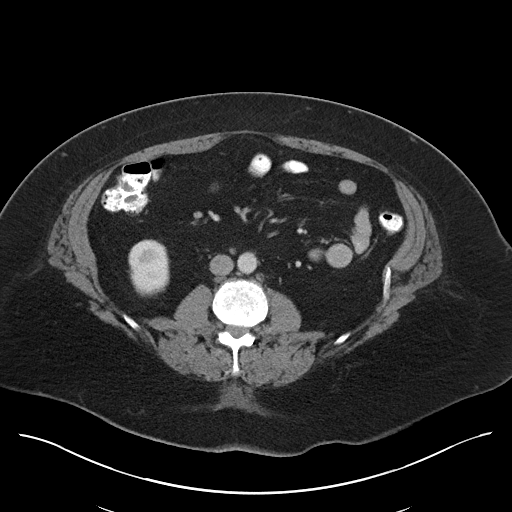
[im 69/94  soft-tissue]
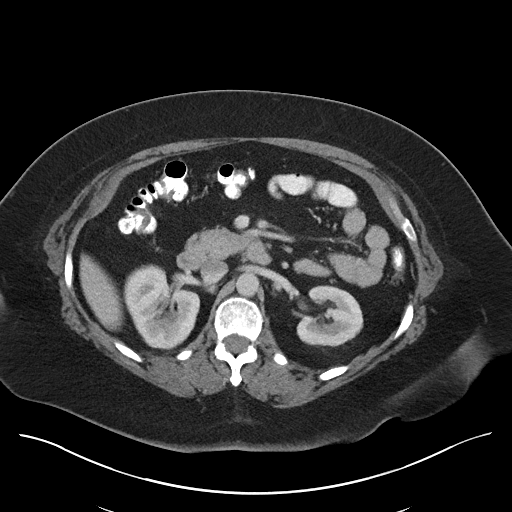
[im 74/94  lung]
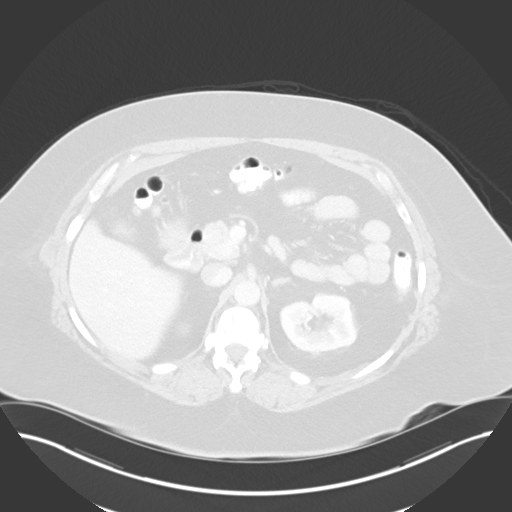
[im 79/94  soft-tissue]
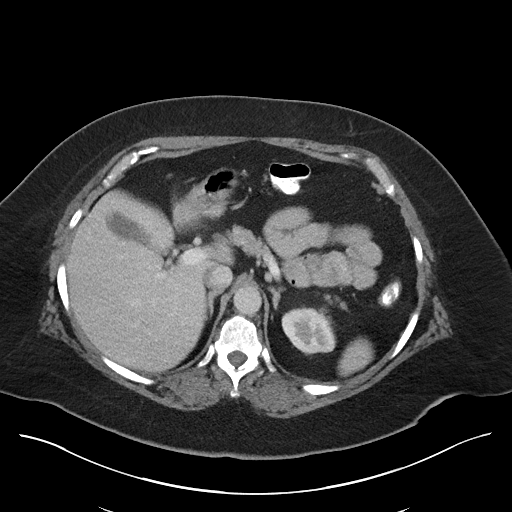
[im 79/94  lung]
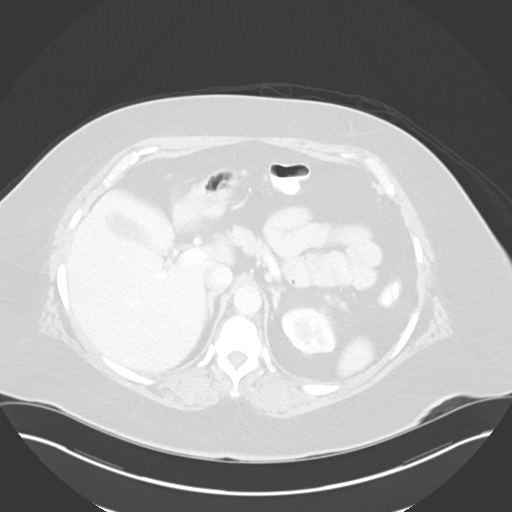
[im 84/94  lung]
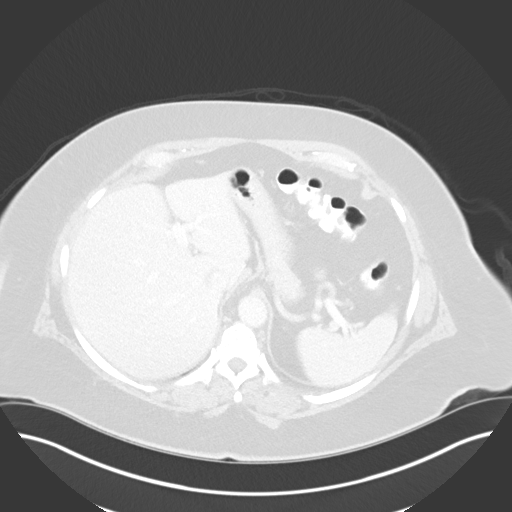
[im 89/94  soft-tissue]
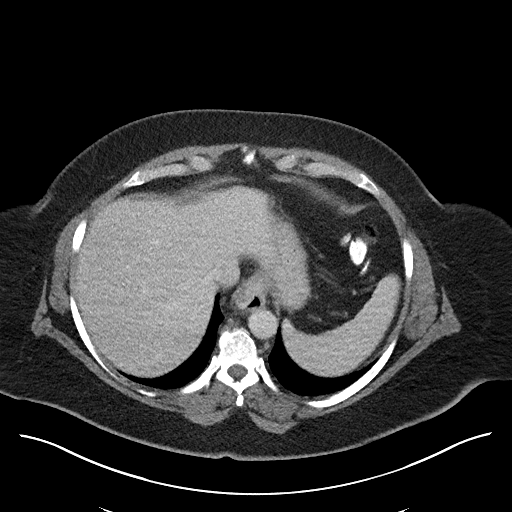
[im 89/94  lung]
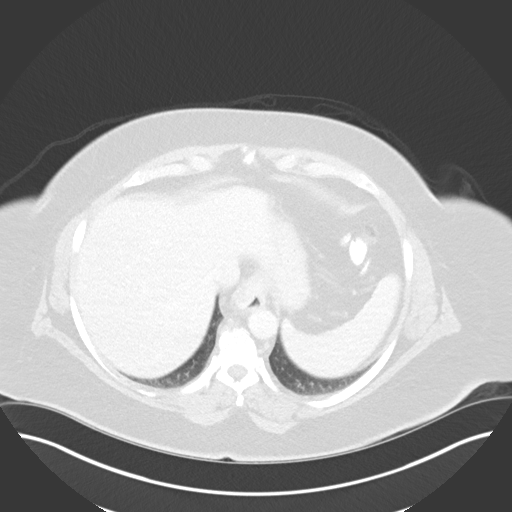
[im 89/94  bone]
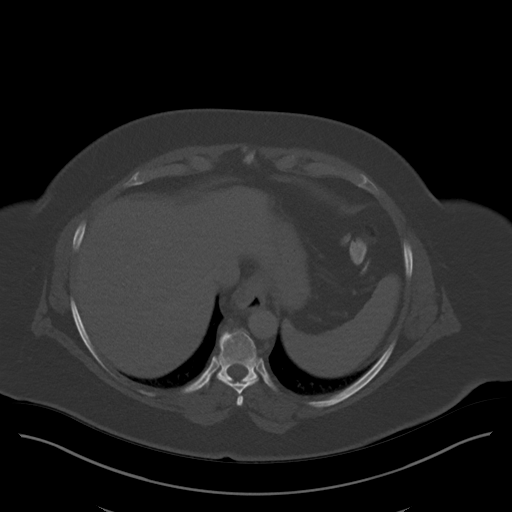

[Series 8: abd/pel w st · coronal · 0.80mm/px · 3 of 91 slices shown]
[im 23/91  soft-tissue]
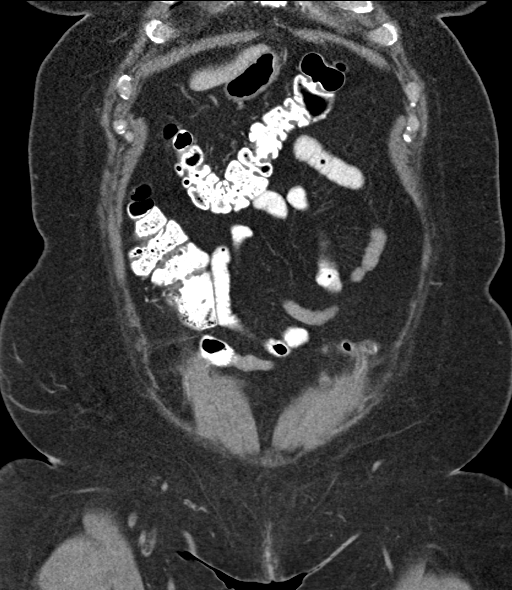
[im 46/91  soft-tissue]
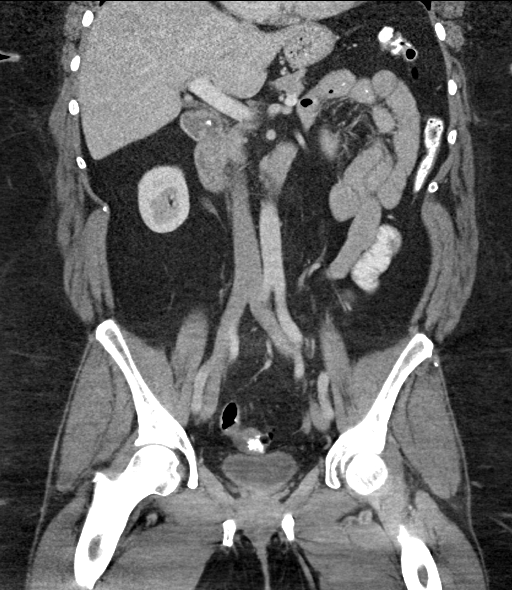
[im 68/91  soft-tissue]
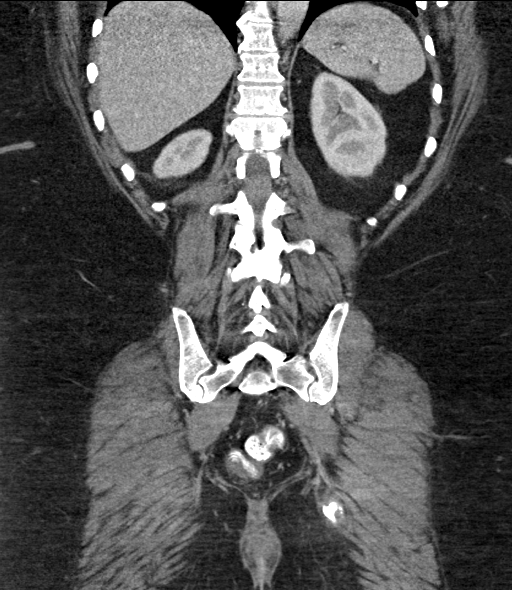

[14 of 46 positions shown; findings below may reference images not displayed]

FINDINGS: Lower chest: The lung bases are clear. The heart is mildly enlarged.
No pericardial effusion is seen. The does appear to be a small
hiatal hernia present.

Hepatobiliary: The liver somewhat low in attenuation which may
indicate fatty infiltration. There is at least 1 gallstone layers
within the neck of the gallbladder measuring 1.7 mm.

Pancreas: The pancreas is normal in size and the pancreatic duct is
not dilated.

Spleen: The spleen is unremarkable.

Adrenals/Urinary Tract: The adrenal glands appear stable and normal.
The kidneys enhance with no calculus or mass and on delayed images,
the pelvocaliceal systems are unremarkable. The ureters are normal
in caliber. The urinary bladder is not well distended but no
abnormality is evident.

Stomach/Bowel: The stomach is decompressed and cannot be well
assessed. No small bowel distention is seen. There are multiple
rectosigmoid colon diverticula present. No diverticulitis is seen.
Descending colon diverticula also are present. The terminal ileum
and the appendix are unremarkable. No inflammatory process is seen.

Vascular/Lymphatic: The abdominal aorta is normal in caliber. No
adenopathy is seen.

Reproductive: The uterus has previously been resected. No adnexal
lesion is seen.

Other: There is a small umbilical hernia present containing fat. No
other abdominal wall abnormality is seen.

Musculoskeletal: The lumbar vertebrae are in normal alignment. There
is significant degenerative disc disease at L5-S1 with almost
complete loss of disc space at that level. The SI joints appear
corticated.
IMPRESSION: 1. Small umbilical hernia containing fat.
2. Multiple rectosigmoid colon and descending colon diverticula. No
diverticulitis.
3. Small hiatal hernia.
4. Degenerative disc disease at L5-S1.
5. Question fatty infiltration of the liver. Single gallstone of
cm.

## 2019-11-09 DIAGNOSIS — M170 Bilateral primary osteoarthritis of knee: Secondary | ICD-10-CM | POA: Diagnosis not present

## 2019-11-14 DIAGNOSIS — H04031 Chronic enlargement of right lacrimal gland: Secondary | ICD-10-CM | POA: Diagnosis not present

## 2019-11-22 DIAGNOSIS — H2512 Age-related nuclear cataract, left eye: Secondary | ICD-10-CM | POA: Diagnosis not present

## 2019-11-22 DIAGNOSIS — H35373 Puckering of macula, bilateral: Secondary | ICD-10-CM | POA: Diagnosis not present

## 2019-11-27 DIAGNOSIS — H2512 Age-related nuclear cataract, left eye: Secondary | ICD-10-CM | POA: Diagnosis not present

## 2019-11-27 DIAGNOSIS — H35372 Puckering of macula, left eye: Secondary | ICD-10-CM | POA: Diagnosis not present

## 2019-11-27 DIAGNOSIS — H524 Presbyopia: Secondary | ICD-10-CM | POA: Diagnosis not present

## 2019-11-27 DIAGNOSIS — Z961 Presence of intraocular lens: Secondary | ICD-10-CM | POA: Diagnosis not present

## 2019-11-29 DIAGNOSIS — L0101 Non-bullous impetigo: Secondary | ICD-10-CM | POA: Diagnosis not present

## 2019-11-29 DIAGNOSIS — L281 Prurigo nodularis: Secondary | ICD-10-CM | POA: Diagnosis not present

## 2019-11-29 DIAGNOSIS — L298 Other pruritus: Secondary | ICD-10-CM | POA: Diagnosis not present

## 2019-11-29 DIAGNOSIS — R21 Rash and other nonspecific skin eruption: Secondary | ICD-10-CM | POA: Diagnosis not present

## 2019-11-29 DIAGNOSIS — F4323 Adjustment disorder with mixed anxiety and depressed mood: Secondary | ICD-10-CM | POA: Diagnosis not present

## 2019-11-30 DIAGNOSIS — M25561 Pain in right knee: Secondary | ICD-10-CM | POA: Diagnosis not present

## 2019-11-30 DIAGNOSIS — M1712 Unilateral primary osteoarthritis, left knee: Secondary | ICD-10-CM | POA: Diagnosis not present

## 2019-11-30 DIAGNOSIS — M1711 Unilateral primary osteoarthritis, right knee: Secondary | ICD-10-CM | POA: Diagnosis not present

## 2019-11-30 DIAGNOSIS — M25562 Pain in left knee: Secondary | ICD-10-CM | POA: Diagnosis not present

## 2019-12-01 DIAGNOSIS — H33001 Unspecified retinal detachment with retinal break, right eye: Secondary | ICD-10-CM | POA: Diagnosis not present

## 2019-12-01 DIAGNOSIS — H35373 Puckering of macula, bilateral: Secondary | ICD-10-CM | POA: Diagnosis not present

## 2019-12-01 DIAGNOSIS — H47311 Coloboma of optic disc, right eye: Secondary | ICD-10-CM | POA: Diagnosis not present

## 2019-12-04 DIAGNOSIS — M5412 Radiculopathy, cervical region: Secondary | ICD-10-CM | POA: Diagnosis not present

## 2019-12-04 DIAGNOSIS — R9089 Other abnormal findings on diagnostic imaging of central nervous system: Secondary | ICD-10-CM | POA: Diagnosis not present

## 2019-12-04 DIAGNOSIS — M5416 Radiculopathy, lumbar region: Secondary | ICD-10-CM | POA: Diagnosis not present

## 2019-12-19 DIAGNOSIS — H04031 Chronic enlargement of right lacrimal gland: Secondary | ICD-10-CM | POA: Diagnosis not present

## 2019-12-26 DIAGNOSIS — M542 Cervicalgia: Secondary | ICD-10-CM | POA: Diagnosis not present

## 2019-12-26 DIAGNOSIS — M5416 Radiculopathy, lumbar region: Secondary | ICD-10-CM | POA: Diagnosis not present

## 2019-12-26 DIAGNOSIS — M4722 Other spondylosis with radiculopathy, cervical region: Secondary | ICD-10-CM | POA: Diagnosis not present

## 2019-12-27 DIAGNOSIS — H02831 Dermatochalasis of right upper eyelid: Secondary | ICD-10-CM | POA: Diagnosis not present

## 2019-12-27 DIAGNOSIS — H02834 Dermatochalasis of left upper eyelid: Secondary | ICD-10-CM | POA: Diagnosis not present

## 2019-12-27 DIAGNOSIS — H57813 Brow ptosis, bilateral: Secondary | ICD-10-CM | POA: Diagnosis not present

## 2019-12-28 DIAGNOSIS — H26491 Other secondary cataract, right eye: Secondary | ICD-10-CM | POA: Diagnosis not present

## 2020-01-04 DIAGNOSIS — M5416 Radiculopathy, lumbar region: Secondary | ICD-10-CM | POA: Diagnosis not present

## 2020-01-08 DIAGNOSIS — Z1231 Encounter for screening mammogram for malignant neoplasm of breast: Secondary | ICD-10-CM | POA: Diagnosis not present

## 2020-01-11 DIAGNOSIS — F4323 Adjustment disorder with mixed anxiety and depressed mood: Secondary | ICD-10-CM | POA: Diagnosis not present

## 2020-01-12 DIAGNOSIS — C44519 Basal cell carcinoma of skin of other part of trunk: Secondary | ICD-10-CM | POA: Diagnosis not present

## 2020-01-12 DIAGNOSIS — Z131 Encounter for screening for diabetes mellitus: Secondary | ICD-10-CM | POA: Diagnosis not present

## 2020-01-18 DIAGNOSIS — H02834 Dermatochalasis of left upper eyelid: Secondary | ICD-10-CM | POA: Diagnosis not present

## 2020-01-18 DIAGNOSIS — M5416 Radiculopathy, lumbar region: Secondary | ICD-10-CM | POA: Diagnosis not present

## 2020-01-18 DIAGNOSIS — M62838 Other muscle spasm: Secondary | ICD-10-CM | POA: Diagnosis not present

## 2020-01-18 DIAGNOSIS — M4722 Other spondylosis with radiculopathy, cervical region: Secondary | ICD-10-CM | POA: Diagnosis not present

## 2020-01-18 DIAGNOSIS — H02831 Dermatochalasis of right upper eyelid: Secondary | ICD-10-CM | POA: Diagnosis not present

## 2020-01-18 DIAGNOSIS — H57813 Brow ptosis, bilateral: Secondary | ICD-10-CM | POA: Diagnosis not present

## 2020-01-18 DIAGNOSIS — M542 Cervicalgia: Secondary | ICD-10-CM | POA: Diagnosis not present

## 2020-01-19 DIAGNOSIS — R7303 Prediabetes: Secondary | ICD-10-CM | POA: Diagnosis not present

## 2020-01-19 DIAGNOSIS — Z23 Encounter for immunization: Secondary | ICD-10-CM | POA: Diagnosis not present

## 2020-01-19 DIAGNOSIS — I10 Essential (primary) hypertension: Secondary | ICD-10-CM | POA: Diagnosis not present

## 2020-01-19 DIAGNOSIS — E559 Vitamin D deficiency, unspecified: Secondary | ICD-10-CM | POA: Diagnosis not present

## 2020-01-23 DIAGNOSIS — H57813 Brow ptosis, bilateral: Secondary | ICD-10-CM | POA: Diagnosis not present

## 2020-01-23 DIAGNOSIS — H02834 Dermatochalasis of left upper eyelid: Secondary | ICD-10-CM | POA: Diagnosis not present

## 2020-01-23 DIAGNOSIS — H02831 Dermatochalasis of right upper eyelid: Secondary | ICD-10-CM | POA: Diagnosis not present

## 2020-01-28 DIAGNOSIS — Z01810 Encounter for preprocedural cardiovascular examination: Secondary | ICD-10-CM | POA: Diagnosis not present

## 2020-01-28 DIAGNOSIS — I252 Old myocardial infarction: Secondary | ICD-10-CM | POA: Diagnosis not present

## 2020-01-28 DIAGNOSIS — Z01812 Encounter for preprocedural laboratory examination: Secondary | ICD-10-CM | POA: Diagnosis not present

## 2020-01-28 DIAGNOSIS — H57811 Brow ptosis, right: Secondary | ICD-10-CM | POA: Diagnosis not present

## 2020-01-28 DIAGNOSIS — R9431 Abnormal electrocardiogram [ECG] [EKG]: Secondary | ICD-10-CM | POA: Diagnosis not present

## 2020-02-07 DIAGNOSIS — Z48810 Encounter for surgical aftercare following surgery on the sense organs: Secondary | ICD-10-CM | POA: Diagnosis not present

## 2020-02-07 DIAGNOSIS — H57813 Brow ptosis, bilateral: Secondary | ICD-10-CM | POA: Diagnosis not present

## 2020-02-07 DIAGNOSIS — H02831 Dermatochalasis of right upper eyelid: Secondary | ICD-10-CM | POA: Diagnosis not present

## 2020-02-07 DIAGNOSIS — H02834 Dermatochalasis of left upper eyelid: Secondary | ICD-10-CM | POA: Diagnosis not present

## 2020-02-15 DIAGNOSIS — M5459 Other low back pain: Secondary | ICD-10-CM | POA: Diagnosis not present

## 2020-02-15 DIAGNOSIS — M47816 Spondylosis without myelopathy or radiculopathy, lumbar region: Secondary | ICD-10-CM | POA: Diagnosis not present

## 2020-02-17 DIAGNOSIS — I10 Essential (primary) hypertension: Secondary | ICD-10-CM | POA: Diagnosis not present

## 2020-02-28 DIAGNOSIS — H57813 Brow ptosis, bilateral: Secondary | ICD-10-CM | POA: Diagnosis not present

## 2020-02-29 DIAGNOSIS — F4323 Adjustment disorder with mixed anxiety and depressed mood: Secondary | ICD-10-CM | POA: Diagnosis not present

## 2020-03-07 DIAGNOSIS — M62838 Other muscle spasm: Secondary | ICD-10-CM | POA: Diagnosis not present

## 2020-03-07 DIAGNOSIS — M4722 Other spondylosis with radiculopathy, cervical region: Secondary | ICD-10-CM | POA: Diagnosis not present

## 2020-03-07 DIAGNOSIS — M542 Cervicalgia: Secondary | ICD-10-CM | POA: Diagnosis not present

## 2020-03-07 DIAGNOSIS — M5416 Radiculopathy, lumbar region: Secondary | ICD-10-CM | POA: Diagnosis not present

## 2020-03-20 DIAGNOSIS — H57813 Brow ptosis, bilateral: Secondary | ICD-10-CM | POA: Diagnosis not present

## 2020-03-20 DIAGNOSIS — H02834 Dermatochalasis of left upper eyelid: Secondary | ICD-10-CM | POA: Diagnosis not present

## 2020-03-20 DIAGNOSIS — H02831 Dermatochalasis of right upper eyelid: Secondary | ICD-10-CM | POA: Diagnosis not present

## 2020-03-29 DIAGNOSIS — H35373 Puckering of macula, bilateral: Secondary | ICD-10-CM | POA: Diagnosis not present

## 2020-03-29 DIAGNOSIS — H2512 Age-related nuclear cataract, left eye: Secondary | ICD-10-CM | POA: Diagnosis not present

## 2020-04-08 DIAGNOSIS — M542 Cervicalgia: Secondary | ICD-10-CM | POA: Diagnosis not present

## 2020-04-08 DIAGNOSIS — M62838 Other muscle spasm: Secondary | ICD-10-CM | POA: Diagnosis not present

## 2020-04-08 DIAGNOSIS — M5416 Radiculopathy, lumbar region: Secondary | ICD-10-CM | POA: Diagnosis not present

## 2020-04-08 DIAGNOSIS — M1712 Unilateral primary osteoarthritis, left knee: Secondary | ICD-10-CM | POA: Diagnosis not present

## 2020-04-08 DIAGNOSIS — M1711 Unilateral primary osteoarthritis, right knee: Secondary | ICD-10-CM | POA: Diagnosis not present

## 2020-04-08 DIAGNOSIS — M4722 Other spondylosis with radiculopathy, cervical region: Secondary | ICD-10-CM | POA: Diagnosis not present

## 2020-04-09 DIAGNOSIS — H2512 Age-related nuclear cataract, left eye: Secondary | ICD-10-CM | POA: Diagnosis not present

## 2020-04-23 DIAGNOSIS — L218 Other seborrheic dermatitis: Secondary | ICD-10-CM | POA: Diagnosis not present

## 2020-04-23 DIAGNOSIS — L281 Prurigo nodularis: Secondary | ICD-10-CM | POA: Diagnosis not present

## 2020-04-23 DIAGNOSIS — L608 Other nail disorders: Secondary | ICD-10-CM | POA: Diagnosis not present
